# Patient Record
Sex: Female | Born: 1985 | Race: White | Hispanic: No | Marital: Married | Smoking: Former smoker
Health system: Southern US, Community
[De-identification: ages and names within clinical notes are randomized; demographics above are authoritative.]

## PROBLEM LIST (undated history)

## (undated) DIAGNOSIS — Z0000 Encounter for general adult medical examination without abnormal findings: Secondary | ICD-10-CM

## (undated) DIAGNOSIS — K219 Gastro-esophageal reflux disease without esophagitis: Secondary | ICD-10-CM

## (undated) DIAGNOSIS — F419 Anxiety disorder, unspecified: Secondary | ICD-10-CM

## (undated) DIAGNOSIS — F32A Depression, unspecified: Secondary | ICD-10-CM

## (undated) DIAGNOSIS — Z9889 Other specified postprocedural states: Secondary | ICD-10-CM

## (undated) DIAGNOSIS — Z13220 Encounter for screening for lipoid disorders: Secondary | ICD-10-CM

## (undated) DIAGNOSIS — D649 Anemia, unspecified: Secondary | ICD-10-CM

## (undated) DIAGNOSIS — F329 Major depressive disorder, single episode, unspecified: Secondary | ICD-10-CM

## (undated) DIAGNOSIS — R112 Nausea with vomiting, unspecified: Secondary | ICD-10-CM

## (undated) DIAGNOSIS — R5383 Other fatigue: Secondary | ICD-10-CM

## (undated) HISTORY — DX: Depression, unspecified: F32.A

## (undated) HISTORY — DX: Other fatigue: R53.83

## (undated) HISTORY — DX: Major depressive disorder, single episode, unspecified: F32.9

## (undated) HISTORY — DX: Anxiety disorder, unspecified: F41.9

## (undated) HISTORY — DX: Encounter for screening for lipoid disorders: Z13.220

## (undated) HISTORY — DX: Encounter for general adult medical examination without abnormal findings: Z00.00

---
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
## 2007-03-06 ENCOUNTER — Emergency Department: Payer: Self-pay | Admitting: Emergency Medicine

## 2007-12-06 ENCOUNTER — Ambulatory Visit: Payer: Self-pay

## 2008-02-27 ENCOUNTER — Observation Stay: Payer: Self-pay | Admitting: Obstetrics and Gynecology

## 2008-04-22 ENCOUNTER — Observation Stay: Payer: Self-pay

## 2008-05-14 ENCOUNTER — Emergency Department: Payer: Self-pay | Admitting: Emergency Medicine

## 2008-05-17 ENCOUNTER — Observation Stay: Payer: Self-pay

## 2008-05-27 ENCOUNTER — Observation Stay: Payer: Self-pay

## 2008-06-01 ENCOUNTER — Inpatient Hospital Stay: Payer: Self-pay | Admitting: Obstetrics and Gynecology

## 2008-08-26 IMAGING — US US BREAST BILAT
1 series · 17 of 25 positions shown · non-contrast
Comparison: none

REASON FOR EXAM: Bilateral fibrocystic tissue upper outer quadrant,
questionable right breast cyst
COMMENTS:

[Series 1: us breast bilat · 17 of 68 slices shown]
[im 1/68]
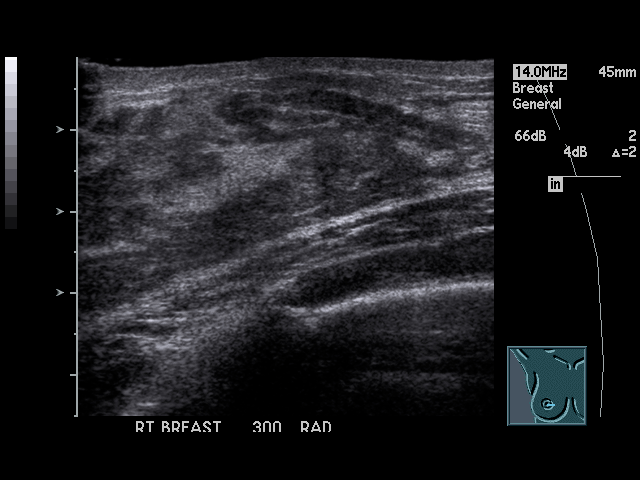
[im 6/68]
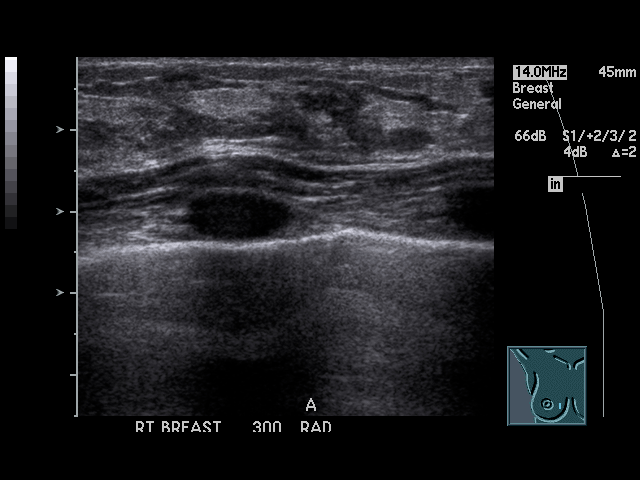
[im 9/68]
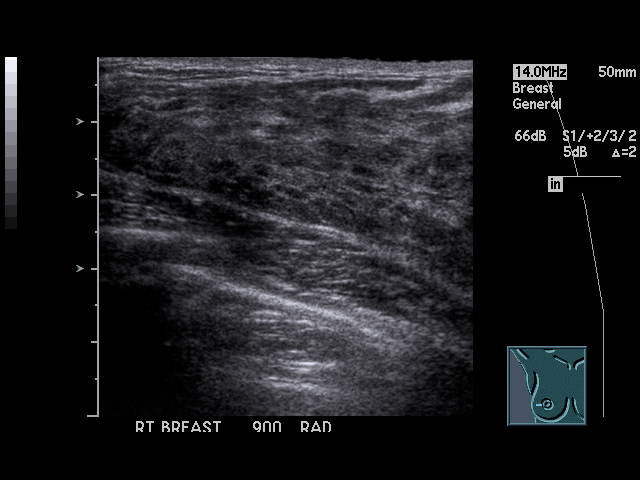
[im 14/68]
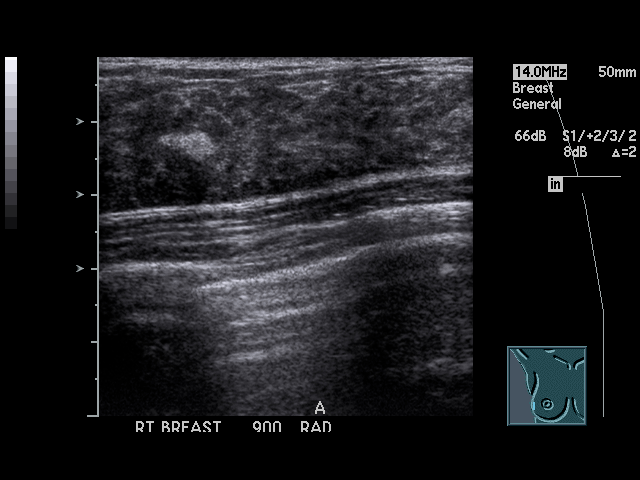
[im 17/68]
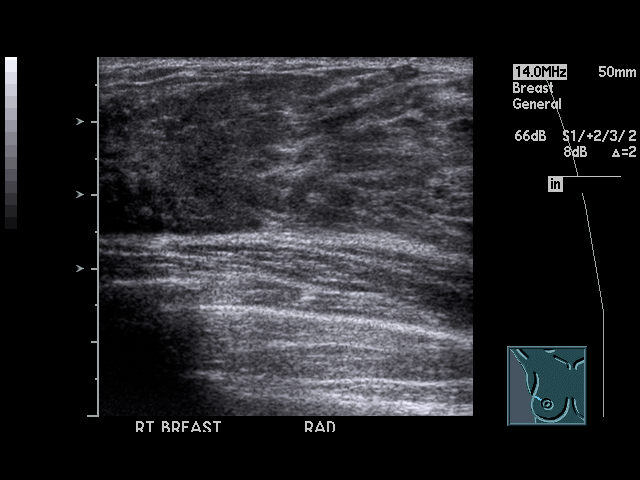
[im 23/68]
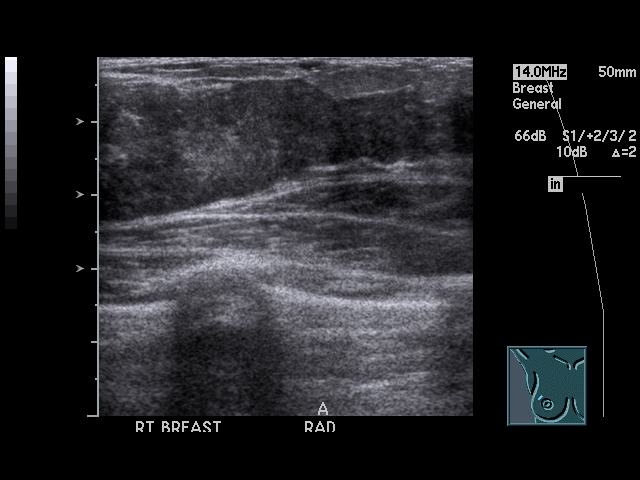
[im 26/68]
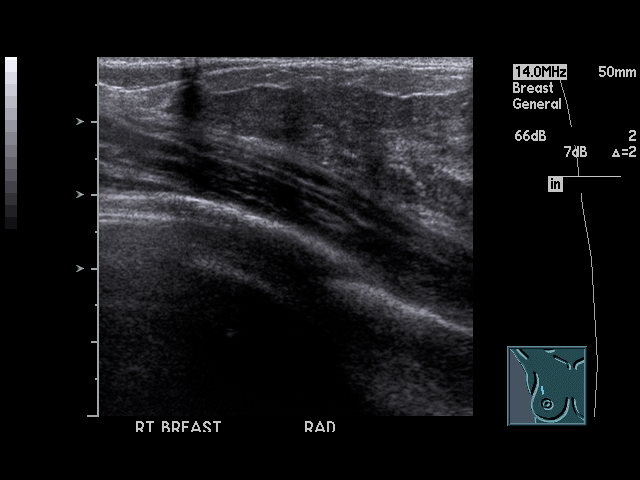
[im 31/68]
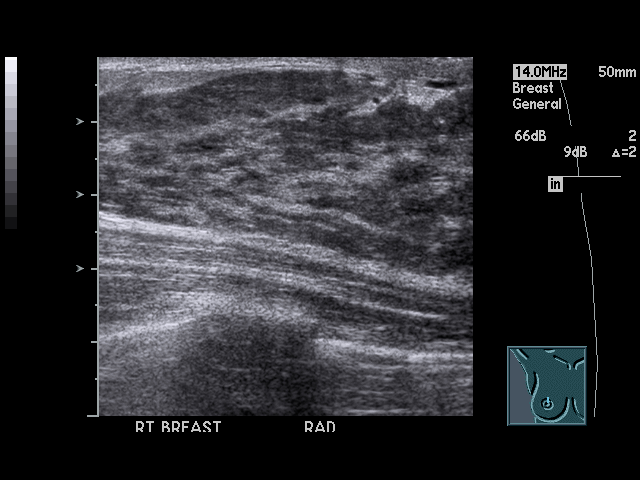
[im 34/68]
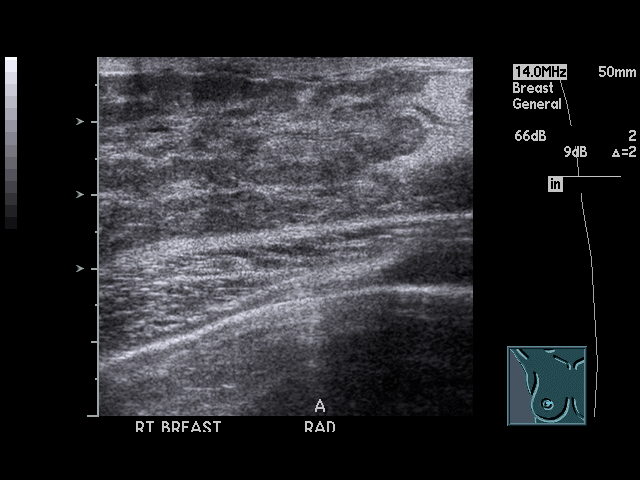
[im 37/68]
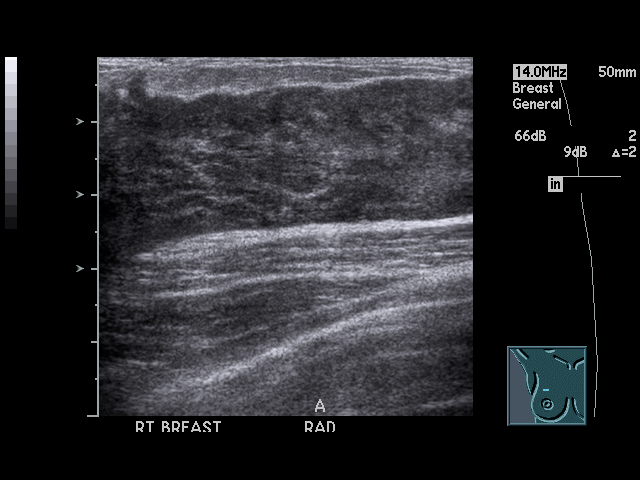
[im 42/68]
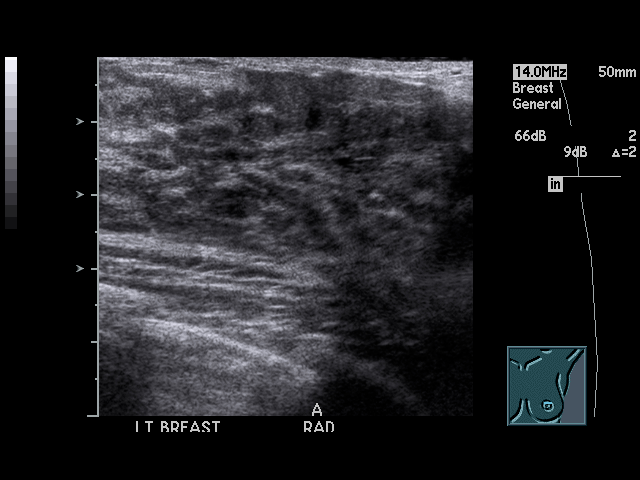
[im 45/68]
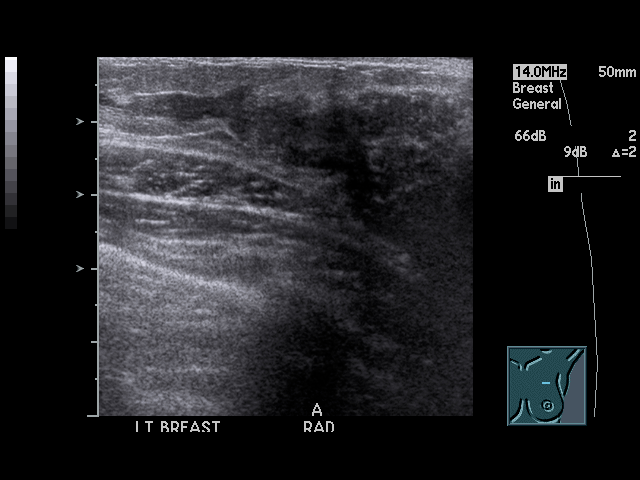
[im 51/68]
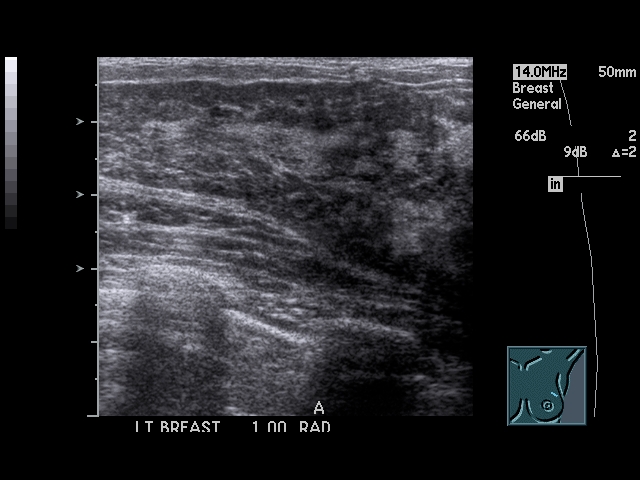
[im 54/68]
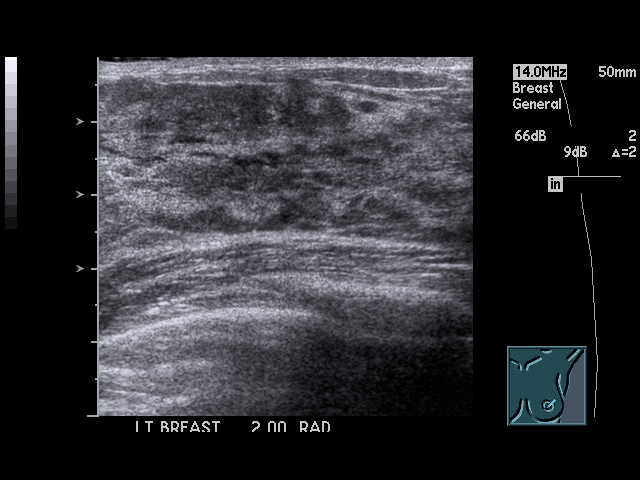
[im 59/68]
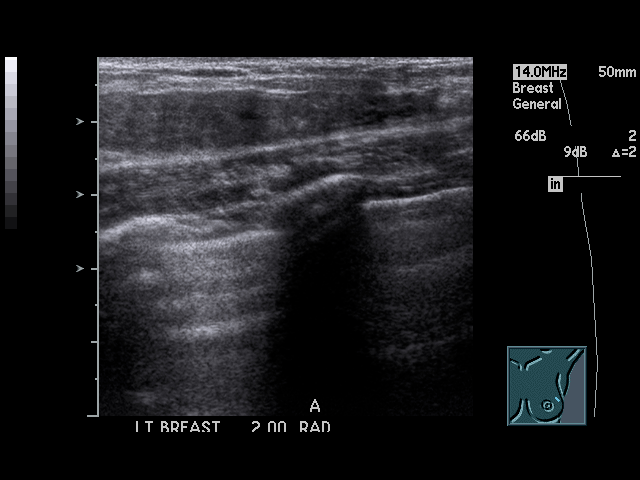
[im 62/68]
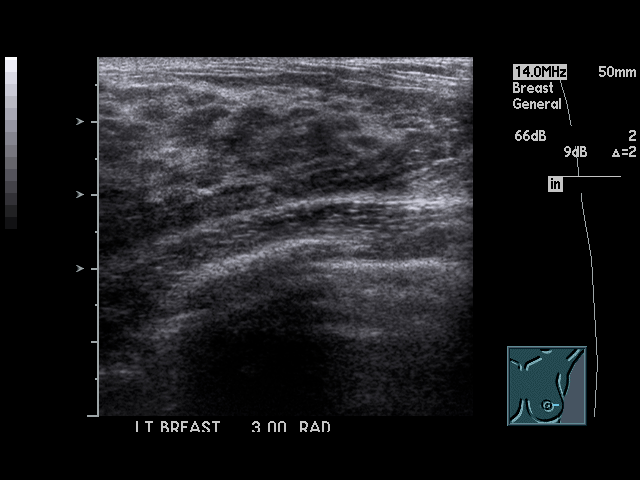
[im 68/68]
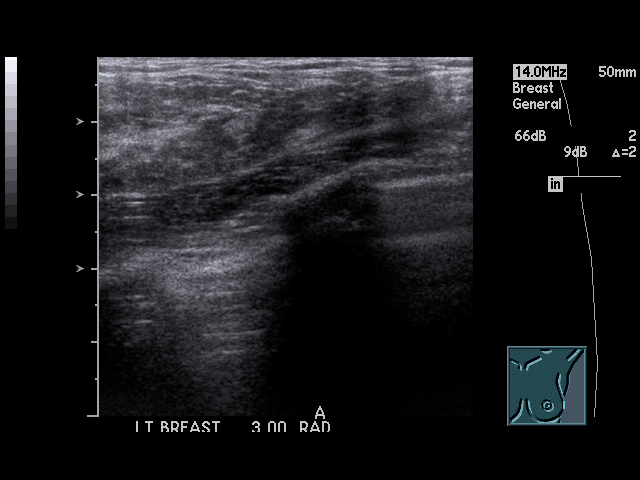

[17 of 25 positions shown; findings below may reference images not displayed]

PROCEDURE:     US  - US BREAST BILATERAL  - December 06, 2007 [DATE]

RESULT:     Targeted ultrasound in the upper/inner quadrant of the RIGHT
breast and upper/outer quadrant of the LEFT breast was performed.

No solid or cystic mass lesions are seen on either side. There is no
distortion of the breast architecture. No skin thickening is seen.
IMPRESSION: Bilaterally benign appearing targeted Breast Ultrasound.

## 2008-09-13 LAB — HM PAP SMEAR

## 2008-09-13 LAB — CONVERTED CEMR LAB

## 2009-09-12 ENCOUNTER — Ambulatory Visit: Payer: Self-pay | Admitting: Family Medicine

## 2009-09-12 DIAGNOSIS — R5383 Other fatigue: Secondary | ICD-10-CM

## 2009-09-12 DIAGNOSIS — R5381 Other malaise: Secondary | ICD-10-CM

## 2009-09-12 LAB — CONVERTED CEMR LAB
AST: 19 units/L (ref 0–37)
Albumin: 4.4 g/dL (ref 3.5–5.2)
Alkaline Phosphatase: 44 units/L (ref 39–117)
Basophils Absolute: 0 10*3/uL (ref 0.0–0.1)
Basophils Relative: 0.6 % (ref 0.0–3.0)
Bilirubin, Direct: 0.1 mg/dL (ref 0.0–0.3)
CO2: 28 meq/L (ref 19–32)
Calcium: 9.4 mg/dL (ref 8.4–10.5)
Eosinophils Absolute: 0.1 10*3/uL (ref 0.0–0.7)
GFR calc non Af Amer: 94.49 mL/min (ref >60)
Glucose, Bld: 84 mg/dL (ref 70–99)
Hemoglobin: 14.8 g/dL (ref 12.0–15.0)
Lymphs Abs: 1.6 10*3/uL (ref 0.7–4.0)
MCHC: 33.4 g/dL (ref 30.0–36.0)
MCV: 93.1 fL (ref 78.0–100.0)
Monocytes Absolute: 0.3 10*3/uL (ref 0.1–1.0)
Neutro Abs: 3.9 10*3/uL (ref 1.4–7.7)
Potassium: 3.9 meq/L (ref 3.5–5.1)
RBC: 4.77 M/uL (ref 3.87–5.11)
RDW: 11.8 % (ref 11.5–14.6)
Sodium: 136 meq/L (ref 135–145)
Total CHOL/HDL Ratio: 3

## 2009-11-17 ENCOUNTER — Ambulatory Visit: Payer: Self-pay | Admitting: Family Medicine

## 2009-11-17 DIAGNOSIS — H6690 Otitis media, unspecified, unspecified ear: Secondary | ICD-10-CM | POA: Insufficient documentation

## 2010-05-07 ENCOUNTER — Ambulatory Visit: Payer: Self-pay | Admitting: Family Medicine

## 2010-05-07 DIAGNOSIS — F341 Dysthymic disorder: Secondary | ICD-10-CM

## 2010-05-27 ENCOUNTER — Ambulatory Visit: Payer: Self-pay | Admitting: Psychology

## 2010-06-12 ENCOUNTER — Ambulatory Visit: Payer: Self-pay | Admitting: Family Medicine

## 2010-09-07 ENCOUNTER — Telehealth: Payer: Self-pay | Admitting: Family Medicine

## 2010-10-27 NOTE — Assessment & Plan Note (Signed)
Summary: 1 m f/u dlo  R/S FROM 06/08/10   Vital Signs:  Patient profile:   25 year old female Height:      62 inches Weight:      116 pounds BMI:     21.29 Temp:     98.3 degrees F oral Pulse rate:   72 / minute Pulse rhythm:   regular BP sitting:   120 / 80  (right arm) Cuff size:   regular  Vitals Entered By: Linde Gillis CMA Duncan Dull) (June 12, 2010 12:35 PM) CC: one month follow up   History of Present Illness: 25 yo here to follow up anxiety/depression.  Since starting Wellbutrin 150 mg daily last month, she feels "significantly better."  Get's less irritable, more energy and drive to get out of bed. Planning her daughter's birthday part which normally would make her anxious and she is not axious at all.  Improved concentration at work. Still has days where she feels a little sad but much less tearful.  Last several months, has been more tearful, irritable. Mom has h/o MDD.  Pt denies and SI or HI.    She would like to try a higher dose for a month or so but is really pleased with how it is working. She is also seeing Dr. Laymond Purser for psychotherapy which she feels is very helpful as well.  Current Medications (verified): 1)  Valtrex 1 Gm Tabs (Valacyclovir Hcl) .... 2 Tabs By Mouth Two Times A Day X 1 Day At First Sign of Mouth Sore. 2)  Wellbutrin Xl 300 Mg Xr24h-Tab (Bupropion Hcl) .Marland Kitchen.. 1 Tab By Mouth Every Morning.  Allergies (verified): No Known Drug Allergies  Past History:  Past Medical History: Last updated: 09/12/2009 G4P2  Family History: Last updated: 09/12/2009 Mom and dad both alive and healthy No CAD in family. Grandfather had Hairy Cell Leukemia, in remission and doing well.  Social History: Last updated: 09/12/2009 Lives with husband and two kids in [place] (ages 11 and 58 mo) Husband is a Engineer, maintenance (Dr. Cyndie Chime patient). 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  Risk Factors: Exercise: no  (09/12/2009)  Risk Factors: Smoking Status: current (09/12/2009)  Review of Systems      See HPI Psych:  Denies irritability, mental problems, panic attacks, sense of great danger, suicidal thoughts/plans, thoughts of violence, unusual visions or sounds, and thoughts /plans of harming others.  Physical Exam  General:  Well-developed,well-nourished,in no acute distress; alert,appropriate and cooperative throughout examination Psych:  Cognition and judgment appear intact. Alert and cooperative with normal attention span and concentration. No apparent delusions, illusions, hallucinations, seems much more relaxed.   Impression & Recommendations:  Problem # 1:  DEPRESSION/ANXIETY (ICD-300.4) Assessment Improved Time spent with patient 25 minutes, more than 50% of this time was spent counseling patient on anxiety/depression.  Will try higher dose, 300 mg daily of Wellbutrin.  Pt to call in one month with an update.  Complete Medication List: 1)  Valtrex 1 Gm Tabs (Valacyclovir hcl) .... 2 tabs by mouth two times a day x 1 day at first sign of mouth sore. 2)  Wellbutrin Xl 300 Mg Xr24h-tab (Bupropion hcl) .Marland Kitchen.. 1 tab by mouth every morning. Prescriptions: WELLBUTRIN XL 300 MG XR24H-TAB (BUPROPION HCL) 1 tab by mouth every morning.  #30 x 11   Entered and Authorized by:   Ruthe Mannan MD   Signed by:   Ruthe Mannan MD on 06/12/2010   Method used:   [facility]  [address] [id number]       [address]       [place], [place]  [address]       Ph: [phone]       Fax: [phone]   RxID:   [id number]   Current Allergies (reviewed today): No known allergies

## 2010-10-27 NOTE — Assessment & Plan Note (Signed)
Summary: EAR INFECTION / LFW   Vital Signs:  Patient profile:   25 year old female Height:      62 inches Weight:      120 pounds BMI:     22.03 Temp:     98.4 degrees F oral Pulse rate:   88 / minute Pulse rhythm:   regular BP sitting:   112 / 74  (left arm) Cuff size:   regular  Vitals Entered By: Delilah Shan CMA Duncan Dull) (November 17, 2009 4:18 PM) CC: ? Ear infection   History of Present Illness: 25 yo with left shooting pain for a few days. Also felt some swollen nodes around her left ear. Was a little congested last week but otherwise feels okay. No fevers, chills, hearing loss, sore throat, or other symptoms.  No pain with chewing. No difficulty swallowing.  Current Medications (verified): 1)  Amoxicillin 500 Mg Tabs (Amoxicillin) .Marland Kitchen.. 1 Tab By Mouth Two Times A Day X 10 Days  Allergies (verified): No Known Drug Allergies  Review of Systems      See HPI General:  Denies chills and fever. ENT:  Complains of earache; denies nasal congestion, postnasal drainage, sinus pressure, and sore throat. CV:  Denies chest pain or discomfort. Resp:  Denies shortness of breath. GI:  Denies diarrhea, nausea, and vomiting.  Physical Exam  General:  Well-developed,well-nourished,in no acute distress; alert,appropriate and cooperative throughout examination Ears:  Left ear- R ear normal and L TM erythema, mod fluid.   Mouth:  Oral mucosa and oropharynx without lesions or exudates.  Teeth in good repair. Lungs:  Normal respiratory effort, chest expands symmetrically. Lungs are clear to auscultation, no crackles or wheezes. Heart:  Normal rate and regular rhythm. S1 and S2 normal without gallop, murmur, click, rub or other extra sounds. Cervical Nodes:  No lymphadenopathy noted Axillary Nodes:  No palpable lymphadenopathy   Impression & Recommendations:  Problem # 1:  LOM (ICD-382.9) Assessment New Although less common in adults, does have typical findings. Will treat with  10 day course of amoxicillin.  follow up as needed. Her updated medication list for this problem includes:    Amoxicillin 500 Mg Tabs (Amoxicillin) .Marland Kitchen... 1 tab by mouth two times a day x 10 days  Complete Medication List: 1)  Amoxicillin 500 Mg Tabs (Amoxicillin) .Marland Kitchen.. 1 tab by mouth two times a day x 10 days Prescriptions: AMOXICILLIN 500 MG TABS (AMOXICILLIN) 1 tab by mouth two times a day x 10 days  #20 x 0   Entered and Authorized by:   Ruthe Mannan MD   Signed by:   Ruthe Mannan MD on 11/17/2009   Method used:   Print then Give to Patient   RxID:   [id number]   Prior Medications (reviewed today): None Current Allergies (reviewed today): No known allergies

## 2010-10-27 NOTE — Assessment & Plan Note (Signed)
Summary: meds and concerns/dlo   Vital Signs:  Patient profile:   25 year old female Height:      62 inches Weight:      117.13 pounds BMI:     21.50 Temp:     98.7 degrees F oral Pulse rate:   72 / minute Pulse rhythm:   regular BP sitting:   120 / 80  (right arm) Cuff size:   regular  Vitals Entered By: Linde Gillis CMA Duncan Dull) (May 07, 2010 7:53 AM) CC: discuss being on medication   History of Present Illness: 25 yo here to discuss anxiety/depression.  Last several months, has been more tearful, irritable. Loss of interest in activities she used to like to do, hypersomnia. Difficulty concentrating at work.  Has two small children in addition to her husbands two small children. Becomes very upset when his ex wife drops his kids off without letting them know in advance.  Loves the children like her own, but likes to be prepared with food, babysitting, etc.  Never been on anything for depression or anxiety in past. Mom has h/o MDD.  Pt denies and SI or HI.    Current Medications (verified): 1)  Wellbutrin Sr 150 Mg Xr12h-Tab (Bupropion Hcl) .Marland Kitchen.. 1 Tab By Mouth Every Morning. 2)  Valtrex 1 Gm Tabs (Valacyclovir Hcl) .... 2 Tabs By Mouth Two Times A Day X 1 Day At First Sign of Mouth Sore.  Allergies (verified): No Known Drug Allergies  Past History:  Past Medical History: Last updated: 09/12/2009 G4P2  Family History: Last updated: 09/12/2009 Mom and dad both alive and healthy No CAD in family. Grandfather had Hairy Cell Leukemia, in remission and doing well.  Social History: Last updated: 09/12/2009 Lives with husband and two kids in [place] (ages 6 and 24 mo) Husband is a Engineer, maintenance (Dr. Cyndie Chime patient). 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  Risk Factors: Exercise: no (09/12/2009)  Risk Factors: Smoking Status: current (09/12/2009)  Review of Systems      See HPI Psych:  Complains of  anxiety, depression, easily angered, easily tearful, and irritability; denies mental problems, panic attacks, sense of great danger, suicidal thoughts/plans, thoughts of violence, unusual visions or sounds, and thoughts /plans of harming others.  Physical Exam  General:  Well-developed,well-nourished,in no acute distress; alert,appropriate and cooperative throughout examination Psych:  Cognition and judgment appear intact. Alert and cooperative with normal attention span and concentration. No apparent delusions, illusions, hallucinations   Impression & Recommendations:  Problem # 1:  DEPRESSION/ANXIETY (ICD-300.4) Assessment New Time spent with patient 25 minutes, more than 50% of this time was spent counseling patient on anxiety/depression. Given symptoms discussed, would like benefit most from Wellbutrin.  Will start with 150 mg daily, follow up in one month. Also referred to psychology for psychotherapy.  Orders: Psychology Referral (Psychology)  Complete Medication List: 1)  Wellbutrin Sr 150 Mg Xr12h-tab (Bupropion hcl) .Marland Kitchen.. 1 tab by mouth every morning. 2)  Valtrex 1 Gm Tabs (Valacyclovir hcl) .... 2 tabs by mouth two times a day x 1 day at first sign of mouth sore.  Patient Instructions: 1)  Please make appointment to see me in one month. 2)  Please stop by to see Shirlee Limerick on your way out. Prescriptions: VALTREX 1 GM TABS (VALACYCLOVIR HCL) 2 tabs by mouth two times a day x 1 day at first sign of mouth sore.  #4 x 0   Entered and Authorized by:   Ruthe Mannan MD  Signed by:   Ruthe Mannan MD on 05/07/2010   Method used:   Historical   RxID:   [id number] WELLBUTRIN SR 150 MG XR12H-TAB (BUPROPION HCL) 1 tab by mouth every morning.  #30 x 0   Entered and Authorized by:   Ruthe Mannan MD   Signed by:   Ruthe Mannan MD on 05/07/2010   Method used:   [id number]       [address]       [place], [place]  [address]       Ph:  [phone]       Fax: [phone]   RxID:   [id number]   Prior Medications (reviewed today): None Current Allergies (reviewed today): No known allergies

## 2010-10-29 NOTE — Progress Notes (Signed)
Summary: refill request for valtrex  Phone Note Refill Request Call back at [phone] Message from:  Patient  Refills Requested: Medication #1:  VALTREX 1 GM TABS 2 tabs by mouth two times a day x 1 day at first sign of mouth sore. [facility] [address]  Initial call taken by: Lowella Petties CMA, AAMA,  September 07, 2010 8:50 AM    Prescriptions: VALTREX 1 GM TABS (VALACYCLOVIR HCL) 2 tabs by mouth two times a day x 1 day at first sign of mouth sore.  #4 x 3   Entered and Authorized by:   Ruthe Mannan MD   Signed by:   Ruthe Mannan MD on 09/07/2010   Method used:   [facility]        [phone][facility] (retail)       [address] [place], [place]  [address]       Ph: [phone] or [phone]       Fax: [phone]   RxID:   [id number]

## 2012-04-24 ENCOUNTER — Encounter: Payer: Self-pay | Admitting: Family Medicine

## 2012-04-25 ENCOUNTER — Other Ambulatory Visit (INDEPENDENT_AMBULATORY_CARE_PROVIDER_SITE_OTHER): Payer: Self-pay

## 2012-04-25 ENCOUNTER — Encounter (INDEPENDENT_AMBULATORY_CARE_PROVIDER_SITE_OTHER): Payer: Self-pay | Admitting: Family Medicine

## 2012-04-25 ENCOUNTER — Other Ambulatory Visit: Payer: Self-pay | Admitting: Family Medicine

## 2012-04-25 DIAGNOSIS — Z0000 Encounter for general adult medical examination without abnormal findings: Secondary | ICD-10-CM

## 2012-04-25 DIAGNOSIS — Z136 Encounter for screening for cardiovascular disorders: Secondary | ICD-10-CM

## 2012-04-25 LAB — COMPREHENSIVE METABOLIC PANEL
ALT: 15 U/L (ref 0–35)
AST: 19 U/L (ref 0–37)
Albumin: 4.4 g/dL (ref 3.5–5.2)
CO2: 26 mEq/L (ref 19–32)
Calcium: 9.6 mg/dL (ref 8.4–10.5)
Chloride: 104 mEq/L (ref 96–112)
Creatinine, Ser: 0.9 mg/dL (ref 0.4–1.2)
Potassium: 3.7 mEq/L (ref 3.5–5.1)
Sodium: 139 mEq/L (ref 135–145)
Total Protein: 6.9 g/dL (ref 6.0–8.3)

## 2012-04-25 LAB — LIPID PANEL
HDL: 72.3 mg/dL (ref >39.00)
Total CHOL/HDL Ratio: 3

## 2012-04-25 NOTE — Progress Notes (Signed)
  Subjective:    Patient ID: Courtney Ruiz, female    DOB: 09-Feb-1986, 26 y.o.   MRN: [id number]  HPI   Review of Systems     Objective:   Physical Exam        Assessment & Plan:

## 2012-04-27 ENCOUNTER — Other Ambulatory Visit: Payer: Self-pay

## 2012-04-27 ENCOUNTER — Ambulatory Visit (INDEPENDENT_AMBULATORY_CARE_PROVIDER_SITE_OTHER): Payer: Self-pay | Admitting: Family Medicine

## 2012-04-27 ENCOUNTER — Other Ambulatory Visit (HOSPITAL_COMMUNITY)
Admission: RE | Admit: 2012-04-27 | Discharge: 2012-04-27 | Disposition: A | Discharge status: Home / Self Care | Payer: Self-pay | Source: Ambulatory Visit | Attending: Family Medicine | Admitting: Family Medicine

## 2012-04-27 ENCOUNTER — Encounter: Payer: Self-pay | Admitting: Family Medicine

## 2012-04-27 VITALS — BP 110/62 | HR 60 | Temp 97.9°F | Ht 61.25 in | Wt 118.0 lb

## 2012-04-27 DIAGNOSIS — Z01419 Encounter for gynecological examination (general) (routine) without abnormal findings: Secondary | ICD-10-CM | POA: Insufficient documentation

## 2012-04-27 DIAGNOSIS — F341 Dysthymic disorder: Secondary | ICD-10-CM

## 2012-04-27 DIAGNOSIS — Z0000 Encounter for general adult medical examination without abnormal findings: Secondary | ICD-10-CM

## 2012-04-27 MED ORDER — CITALOPRAM HYDROBROMIDE 20 MG PO TABS: 20.0000 mg | ORAL_TABLET | Freq: Every day | ORAL | Status: DC

## 2012-04-27 NOTE — Patient Instructions (Addendum)
We are starting celexa. Please call me in 1 month with an update.  The first few days, try cutting your tablet in half.  Citalopram tablets What is this medicine? CITALOPRAM (sye TAL oh pram) is a medicine for depression. This medicine may be used for other purposes; ask your health care provider or pharmacist if you have questions. What should I tell my health care provider before I take this medicine? They need to know if you have any of these conditions: -bipolar disorder or a family history of bipolar disorder -diabetes -heart disease -history of irregular heartbeat -kidney or liver disease -low levels of magnesium or potassium in the blood -receiving electroconvulsive therapy -seizures (convulsions) -suicidal thoughts or a previous suicide attempt -an unusual or allergic reaction to citalopram, escitalopram, other medicines, foods, dyes, or preservatives -pregnant or trying to become pregnant -breast-feeding How should I use this medicine? Take this medicine by mouth with a glass of water. Follow the directions on the prescription label. You can take it with or without food. Take your medicine at regular intervals. Do not take your medicine more often than directed. Do not stop taking except on your doctor's advice. A special MedGuide will be given to you by the pharmacist with each prescription and refill. Be sure to read this information carefully each time. Talk to your pediatrician regarding the use of this medicine in children. Special care may be needed. Patients over 35 years old may have a stronger reaction and need a smaller dose. Overdosage: If you think you have taken too much of this medicine contact a poison control center or emergency room at once. NOTE: This medicine is only for you. Do not share this medicine with others. What if I miss a dose? If you miss a dose, take it as soon as you can. If it is almost time for your next dose, take only that dose. Do not take  double or extra doses. What may interact with this medicine? Do not take this medicine with any of the following medications: -certain diet drugs like dexfenfluramine, fenfluramine, phentermine, sibutramine -cisapride -escitalopram -linezolid -MAOIs like Carbex, Eldepryl, Marplan, Nardil, and Parnate -methylene blue -pimozide -procarbazine -tryptophan This medicine may also interact with the following medications: -amphetamine or dextroamphetamine -arsenic trioxide -aspirin and aspirin-like drugs -carbamazepine -certain medicines for fungal infections like ketoconazole and itraconazole -certain medicines used to treat infections like chloroquine, clarithromycin, erythromycin, pentamidine -certain medicines for irregular heart beat like amiodarone, disopyramide, dofetilide, ibutilide, procainamide, propafenone, quinidine, sotalol -cimetidine -lithium -medicines for depression, anxiety, or psychotic disturbances -medicines for migraine headache like almotriptan, eletriptan, frovatriptan, naratriptan, rizatriptan, sumatriptan, zolmitriptan -medicines that treat or prevent blood clots like warfarin, enoxaparin, and dalteparin -medicines that treat HIV infection or AIDS -methadone -metoprolol -NSAIDs, medicines for pain and inflammation, like ibuprofen or naproxen -omeprazole -posaconazole -St. John's wort -tramadol This list may not describe all possible interactions. Give your health care provider a list of all the medicines, herbs, non-prescription drugs, or dietary supplements you use. Also tell them if you smoke, drink alcohol, or use illegal drugs. Some items may interact with your medicine. What should I watch for while using this medicine? Visit your doctor or health care professional for regular checks on your progress. Continue to take your medicine even if you do not feel better right away. It can take about 4 weeks before you feel the full effect of this medicine. Patients  and their families should watch out for depression or thoughts of suicide that get worse.  Also watch out for sudden or severe changes in feelings such as feeling anxious, agitated, panicky, irritable, hostile, aggressive, impulsive, severely restless, overly excited and hyperactive, or not being able to sleep. If this happens, especially at the beginning of antidepressant treatment or after a change in dose, call your health care professional. If you have been taking this medicine regularly for some time, do not suddenly stop taking it. You must gradually reduce the dose, or your symptoms may get worse. Ask your doctor or health care professional for advice. You may get drowsy or dizzy. Do not drive, use machinery, or do anything that needs mental alertness until you know how this medicine affects you. Do not stand or sit up quickly, especially if you are an older patient. This reduces the risk of dizzy or fainting spells. Alcohol may interfere with the effect of this medicine. Avoid alcoholic drinks. Do not treat yourself for coughs, colds, or allergies without asking your doctor or health care professional for advice. Some ingredients can increase possible side effects. Your mouth may get dry. Chewing sugarless gum or sucking hard candy, and drinking plenty of water will help. What side effects may I notice from receiving this medicine? Side effects that you should report to your doctor or health care professional as soon as possible: -allergic reactions like skin rash, itching or hives, swelling of the face, lips, or tongue -chest pain -confusion -dizziness -fast, irregular heartbeat -fast talking and excited feelings or actions that are out of control -feeling faint or lightheaded, falls -hallucination, loss of contact with reality -seizures -shortness of breath -suicidal thoughts or other mood changes -unusual bleeding or bruising Side effects that usually do not require medical attention  (report to your doctor or health care professional if they continue or are bothersome): -blurred vision -change in appetite -change in sex drive or performance -headache -increased sweating -nausea -trouble sleeping This list may not describe all possible side effects. Call your doctor for medical advice about side effects. You may report side effects to FDA at 1-800-FDA-1088. Where should I keep my medicine? Keep out of reach of children. Store at room temperature between 15 and 30 degrees C (59 and 86 degrees F). Throw away any unused medicine after the expiration date. NOTE: This sheet is a summary. It may not cover all possible information. If you have questions about this medicine, talk to your doctor, pharmacist, or health care provider.  2012, [employer]vier/[employer] Standard. (05/22/2010 10:48:06 AM)

## 2012-04-27 NOTE — Telephone Encounter (Signed)
[facility] and med was not there. I spoke with Nedra Hai [facility] Cital[facility]ram ready for pick up. Pt notified while on phone.

## 2012-04-27 NOTE — Progress Notes (Signed)
Subjective:    Patient ID: Courtney Ruiz, female    DOB: 1986-05-12, 26 y.o.   MRN: [id number]  HPI 26 yo healthy G4P2 here for CPX.    Well woman-  Has Mirena IUD (inserted 09/2008).  Minimal periods, very happy with Mirena.  Sexually active with husband of 2 years only.  Never had an abnormal pap.   Last pap smear in ?2009.  Has GYN but would like pap smear today. Smokes occassionally when she is drinking with friends, feels she is not addicted.  Anxiety/depression- symptoms were controlled on Wellbutrin.  Stopped taking it- was causing nausea.  Still feels very anxious, at times tearful. No SI or HI but would like to try another antidepressant.  Patient Active Problem List  Diagnosis  . DEPRESSION/ANXIETY  . LOM  . FATIGUE  . Routine general medical examination at a health care facility   Past Medical History  Diagnosis Date  . Depression   . Anxiety   . Fatigue   . Routine general medical examination at a health care facility   . Screening for lipoid disorders    No past surgical history on file. History  Substance Use Topics  . Smoking status: Current Everyday Smoker  . Smokeless tobacco: Not on file  . Alcohol Use: Yes   Family History  Problem Relation Age of Onset  . Coronary artery disease Neg Hx    Allergies not on file Current Outpatient Prescriptions on File Prior to Visit  Medication Sig Dispense Refill  . buPROPion (WELLBUTRIN XL) 300 MG 24 hr tablet Take 300 mg by mouth daily.      . valACYclovir (VALTREX) 1000 MG tablet 2 tablets by mouth two times a day x one day at first sign of mouth sore       The PMH, PSH, Social History, Family History, Medications, and allergies have been reviewed in [facility], and have been updated if relevant.   Review of Systems See HPI Patient reports no  vision/ hearing changes,anorexia, weight change, fever ,adenopathy, persistant / recurrent hoarseness, swallowing issues, chest pain, edema,persistant / recurrent cough,  hemoptysis, dyspnea(rest, exertional, paroxysmal nocturnal), gastrointestinal  bleeding (melena, rectal bleeding), abdominal pain, excessive heart burn, GU symptoms(dysuria, hematuria, pyuria, voiding/incontinence  Issues) syncope, focal weakness, severe memory loss, concerning skin lesions, depression, anxiety, abnormal bruising/bleeding, major joint swelling, breast masses or abnormal vaginal bleeding.       Objective:   Physical Exam BP 110/62  Pulse 60  Temp 97.9 F (36.6 C)  Ht 5' 1.25" (1.556 m)  Wt 118 lb (53.524 kg)  BMI 22.11 kg/m2   General:  Well-developed,well-nourished,in no acute distress; alert,appropriate and cooperative throughout examination Head:  normocephalic and atraumatic.   Eyes:  vision grossly intact, pupils equal, pupils round, and pupils reactive to light.   Ears:  R ear normal and L ear normal.   Nose:  no external deformity.   Mouth:  good dentition.   Neck:  No deformities, masses, or tenderness noted. Breasts:  No mass, nodules, thickening, tenderness, bulging, retraction, inflamation, nipple discharge or skin changes noted.   Lungs:  Normal respiratory effort, chest expands symmetrically. Lungs are clear to auscultation, no crackles or wheezes. Heart:  Normal rate and regular rhythm. S1 and S2 normal without gallop, murmur, click, rub or other extra sounds. Abdomen:  Bowel sounds positive,abdomen soft and non-tender without masses, organomegaly or hernias noted. Rectal:  no external abnormalities.   Genitalia:  Pelvic Exam:        External:  normal female genitalia without lesions or masses        Vagina: normal without lesions or masses        Cervix: normal without lesions or masses        IUD strings visible, in place.        Adnexa: normal bimanual exam without masses or fullness        Uterus: normal by palpation        Pap smear: performed Msk:  No deformity or scoliosis noted of thoracic or lumbar spine.   Extremities:  No clubbing, cyanosis,  edema, or deformity noted with normal full range of motion of all joints.   Neurologic:  alert & oriented X3 and gait normal.   Skin:  Intact without suspicious lesions or rashes Cervical Nodes:  No lymphadenopathy noted Axillary Nodes:  No palpable lymphadenopathy Psych:  Cognition and judgment appear intact. Alert and cooperative with normal attention span and concentration. No apparent delusions, illusions, hallucinations     Assessment & Plan:   1. Routine general medical examination at a health care facility  Reviewed preventive care protocols, scheduled due services, and updated immunizations Discussed nutrition, exercise, diet, and healthy lifestyle.  Cytology - PAP  2. DEPRESSION/ANXIETY  Deteriorated. D/c wellbutrin. Start celexa 20 mg daily. See pt instructions for details.

## 2012-05-02 ENCOUNTER — Encounter: Payer: Self-pay | Admitting: Family Medicine

## 2012-05-02 ENCOUNTER — Encounter: Payer: Self-pay | Admitting: *Deleted

## 2014-01-17 ENCOUNTER — Encounter: Payer: Self-pay | Admitting: Family Medicine

## 2014-01-17 ENCOUNTER — Encounter: Payer: Self-pay | Admitting: *Deleted

## 2014-01-17 ENCOUNTER — Ambulatory Visit (INDEPENDENT_AMBULATORY_CARE_PROVIDER_SITE_OTHER): Payer: BC Managed Care – PPO | Admitting: Family Medicine

## 2014-01-17 VITALS — BP 104/68 | HR 69 | Temp 98.2°F | Ht 61.25 in | Wt 126.0 lb

## 2014-01-17 DIAGNOSIS — Z0000 Encounter for general adult medical examination without abnormal findings: Secondary | ICD-10-CM

## 2014-01-17 DIAGNOSIS — Z136 Encounter for screening for cardiovascular disorders: Secondary | ICD-10-CM

## 2014-01-17 DIAGNOSIS — F988 Other specified behavioral and emotional disorders with onset usually occurring in childhood and adolescence: Secondary | ICD-10-CM

## 2014-01-17 LAB — COMPREHENSIVE METABOLIC PANEL
ALK PHOS: 41 U/L (ref 39–117)
ALT: 25 U/L (ref 0–35)
AST: 20 U/L (ref 0–37)
Albumin: 4.4 g/dL (ref 3.5–5.2)
BILIRUBIN TOTAL: 0.9 mg/dL (ref 0.3–1.2)
BUN: 16 mg/dL (ref 6–23)
CO2: 29 mEq/L (ref 19–32)
CREATININE: 0.9 mg/dL (ref 0.4–1.2)
Calcium: 9.7 mg/dL (ref 8.4–10.5)
Chloride: 102 mEq/L (ref 96–112)
GFR: 78.62 mL/min (ref >60.00)
Glucose, Bld: 82 mg/dL (ref 70–99)
Potassium: 3.7 mEq/L (ref 3.5–5.1)
SODIUM: 138 meq/L (ref 135–145)
TOTAL PROTEIN: 7.5 g/dL (ref 6.0–8.3)

## 2014-01-17 LAB — LIPID PANEL
CHOL/HDL RATIO: 3
Cholesterol: 200 mg/dL (ref 0–200)
HDL: 64.6 mg/dL (ref >39.00)
LDL Cholesterol: 123 mg/dL — ABNORMAL HIGH (ref 0–99)
TRIGLYCERIDES: 64 mg/dL (ref 0.0–149.0)
VLDL: 12.8 mg/dL (ref 0.0–40.0)

## 2014-01-17 LAB — CBC WITH DIFFERENTIAL/PLATELET
BASOS ABS: 0.1 10*3/uL (ref 0.0–0.1)
Basophils Relative: 0.8 % (ref 0.0–3.0)
EOS ABS: 0.3 10*3/uL (ref 0.0–0.7)
Eosinophils Relative: 5 % (ref 0.0–5.0)
HEMATOCRIT: 44.2 % (ref 36.0–46.0)
HEMOGLOBIN: 15.2 g/dL — AB (ref 12.0–15.0)
LYMPHS ABS: 1.9 10*3/uL (ref 0.7–4.0)
Lymphocytes Relative: 29.6 % (ref 12.0–46.0)
MCHC: 34.5 g/dL (ref 30.0–36.0)
MCV: 90.1 fl (ref 78.0–100.0)
MONO ABS: 0.3 10*3/uL (ref 0.1–1.0)
MONOS PCT: 5.3 % (ref 3.0–12.0)
NEUTROS ABS: 3.9 10*3/uL (ref 1.4–7.7)
Neutrophils Relative %: 59.3 % (ref 43.0–77.0)
PLATELETS: 259 10*3/uL (ref 150.0–400.0)
RBC: 4.9 Mil/uL (ref 3.87–5.11)
RDW: 12.6 % (ref 11.5–14.6)
WBC: 6.6 10*3/uL (ref 4.5–10.5)

## 2014-01-17 LAB — TSH: TSH: 2.78 u[IU]/mL (ref 0.35–5.50)

## 2014-01-17 MED ORDER — AMPHETAMINE-DEXTROAMPHET ER 20 MG PO CP24: 20.0000 mg | ORAL_CAPSULE | Freq: Every day | ORAL | Status: DC

## 2014-01-17 NOTE — Progress Notes (Signed)
Pre visit review using our clinic review tool, if applicable. No additional management support is needed unless otherwise documented below in the visit note. 

## 2014-01-17 NOTE — Assessment & Plan Note (Signed)
>  25 minutes spent in face to face time with patient, >50% spent in counselling or coordination of care She has had formal psych testing and results have been scanned into chart. Start Adderall 20 mg XL daily. Follow up 1 month. The patient indicates understanding of these issues and agrees with the plan.

## 2014-01-17 NOTE — Assessment & Plan Note (Signed)
Reviewed preventive care protocols, scheduled due services, and updated immunizations Discussed nutrition, exercise, diet, and healthy lifestyle.  Has GYN.  Orders Placed This Encounter  Procedures  . CBC with Differential  . Comprehensive metabolic panel  . Lipid panel  . TSH

## 2014-01-17 NOTE — Patient Instructions (Addendum)
Great to see you. You can call me in about a month to let me know how the adderall is working.  We will call you with your lab results.

## 2014-01-17 NOTE — Progress Notes (Signed)
Subjective:    Patient ID: Courtney Ruiz, female    DOB: [birth year], 28 y.o.   MRN: [id number]  HPI 28 yo healthy G4P2 here for CPX.   Had Mirena but now [facility]. Scheduled for pap smear in 01/2014. Last pap smear was done by me in 04/27/2012.  Sexually active with husband only.  Never had an abnormal pap.     Smoking a little more than she used to- less than 1 ppd.  Not ready to quit smoking yet.  Has promised her kids she will quit by the time she is 28 yo.  Anxiety/depression- stopped taking Wellbutrin- did not like how it made her feel- also caused nausea.  Stopped taking Celexa as well- could not remember to take it every day.  She started to see Harle BattiestJulia Tabor, therapist- sees her once a week.  Amil AmenJulia sent me a letter stating that that she test highly positive for ADD- 87%, and recommended stimulants.    Has been drinking monster drinks- therapist thinks she is self medicating.  She also thinks that her anxiety is mainly due to her ADD.  She has five kids and having difficulty focusing at home and at work.   Patient Active Problem List   Diagnosis Date Noted  . Routine general medical examination at a health care facility 04/25/2012  . DEPRESSION/ANXIETY 05/07/2010   Past Medical History  Diagnosis Date  . Depression   . Anxiety   . Fatigue   . Routine general medical examination at a health care facility   . Screening for lipoid disorders    No past surgical history on file. History  Substance Use Topics  . Smoking status: Current Every Day Smoker -- 0.50 packs/day    Types: Cigarettes  . Smokeless tobacco: Not on file  . Alcohol Use: Yes     Comment: occ   Family History  Problem Relation Age of Onset  . Coronary artery disease Neg Hx    No Known Allergies Current Outpatient Prescriptions on File Prior to Visit  Medication Sig Dispense Refill  . valACYclovir (VALTREX) 1000 MG tablet 2 tablets by mouth two times a day x one day at  first sign of mouth sore       No current facility-administered medications on file prior to visit.   The PMH, PSH, Social History, Family History, Medications, and allergies have been reviewed [facility], and have been updated if relevant.   Review of Systems See HPI +fatigue  Patient reports no  vision/ hearing changes,anorexia, weight change, fever ,adenopathy, persistant / recurrent hoarseness, swallowing issues, chest pain, edema,persistant / recurrent cough, hemoptysis, dyspnea(rest, exertional, paroxysmal nocturnal), gastrointestinal  bleeding (melena, rectal bleeding), abdominal pain, excessive heart burn, GU symptoms(dysuria, hematuria, pyuria, voiding/incontinence  Issues) syncope, focal weakness, severe memory loss, concerning skin lesions, depression, anxiety, abnormal bruising/bleeding, major joint swelling, breast masses or abnormal vaginal bleeding.       Objective:   Physical Exam BP 104/68  Pulse 69  Temp(Src) 98.2 F (36.8 C) (Oral)  Ht 5' 1.25" (1.556 m)  Wt 126 lb (57.153 kg)  BMI 23.61 kg/m2  SpO2 97%   General:  Well-developed,well-nourished,in no acute distress; alert,appropriate and cooperative throughout examination Head:  normocephalic and atraumatic.   Eyes:  vision grossly intact, pupils equal, pupils round, and pupils reactive to light.   Ears:  R ear normal and L ear normal.   Nose:  no external deformity.   Mouth:  good dentition.  Neck:  No deformities, masses, or tenderness noted..   Lungs:  Normal respiratory effort, chest expands symmetrically. Lungs are clear to auscultation, no crackles or wheezes. Heart:  Normal rate and regular rhythm. S1 and S2 normal without gallop, murmur, click, rub or other extra sounds. Abdomen:  Bowel sounds positive,abdomen soft and non-tender without masses, organomegaly or hernias noted. Msk:  No deformity or scoliosis noted of thoracic or lumbar spine.   Extremities:  No clubbing, cyanosis, edema, or deformity noted  with normal full range of motion of all joints.   Neurologic:  alert & oriented X3 and gait normal.   Skin:  Intact without suspicious lesions or rashes Cervical Nodes:  No lymphadenopathy noted Axillary Nodes:  No palpable lymphadenopathy Psych:  Cognition and judgment appear intact. Alert and cooperative with normal attention span and concentration. No apparent delusions, illusions, hallucinations     Assessment & Plan:

## 2014-01-18 ENCOUNTER — Telehealth: Payer: Self-pay | Admitting: Family Medicine

## 2014-01-18 NOTE — Telephone Encounter (Signed)
Relevant patient education mailed to patient.  

## 2014-02-12 ENCOUNTER — Other Ambulatory Visit: Payer: Self-pay

## 2014-02-12 NOTE — Telephone Encounter (Signed)
Pt left v/m requesting adderall. Call when ready for pick up. Pt v/m included request to call pt to let Dr Dayton MartesAron know how Adderall was working. Called pt and no v/m available.

## 2014-02-13 MED ORDER — AMPHETAMINE-DEXTROAMPHETAMINE 10 MG PO TABS: 10.0000 mg | ORAL_TABLET | Freq: Two times a day (BID) | ORAL | Status: DC

## 2014-02-13 MED ORDER — AMPHETAMINE-DEXTROAMPHET ER 20 MG PO CP24: 20.0000 mg | ORAL_CAPSULE | Freq: Every day | ORAL | Status: DC

## 2014-02-13 NOTE — Telephone Encounter (Signed)
Spoke to pt and informed her of dose change and will pick up Rx

## 2014-02-13 NOTE — Telephone Encounter (Signed)
Spoke to pt and informed her Rx is available for pick up at the front desk 

## 2014-02-13 NOTE — Telephone Encounter (Signed)
Pt states that she crashes in the afternoon and is requesting to take 2tabs daily instead of having the extended relief.

## 2014-02-13 NOTE — Telephone Encounter (Signed)
Yes rx changed and printed.

## 2014-02-13 NOTE — Addendum Note (Signed)
Addended by: Dianne DunARON, Lemma Tetro M on: 02/13/2014 10:14 AM   Modules accepted: Orders, Medications

## 2014-02-15 ENCOUNTER — Encounter: Payer: Self-pay | Admitting: Family Medicine

## 2014-03-11 ENCOUNTER — Encounter: Payer: Self-pay | Admitting: Family Medicine

## 2014-03-26 ENCOUNTER — Other Ambulatory Visit: Payer: Self-pay | Admitting: Family Medicine

## 2014-03-26 NOTE — Telephone Encounter (Signed)
Ok to print and put in my box for pick up.

## 2014-03-26 NOTE — Telephone Encounter (Signed)
Pt requesting refill on Adderall XR because she is tired of taking 2 pills.  Adderall last filled 02/13/2014.

## 2014-03-27 MED ORDER — AMPHETAMINE-DEXTROAMPHET ER 20 MG PO CP24: 20.0000 mg | ORAL_CAPSULE | ORAL | Status: DC

## 2014-03-27 NOTE — Telephone Encounter (Signed)
Spoke to pt and informed her Rx will be available for pickup after 1300

## 2014-05-07 ENCOUNTER — Other Ambulatory Visit: Payer: Self-pay

## 2014-05-07 NOTE — Telephone Encounter (Signed)
Pt left v/m requesting rx for Adderall XR. Call when ready for pick up.  

## 2014-05-08 MED ORDER — AMPHETAMINE-DEXTROAMPHET ER 20 MG PO CP24: 20.0000 mg | ORAL_CAPSULE | ORAL | Status: DC

## 2014-05-08 NOTE — Telephone Encounter (Signed)
Lm on pts vm informing her Rx is available for pickup at the front desk 

## 2014-07-17 ENCOUNTER — Other Ambulatory Visit: Payer: Self-pay

## 2014-07-17 MED ORDER — AMPHETAMINE-DEXTROAMPHET ER 20 MG PO CP24: 20.0000 mg | ORAL_CAPSULE | ORAL | Status: DC

## 2014-07-17 NOTE — Telephone Encounter (Signed)
Pt left v/m requesting rx for Adderall XR. Call when ready for pick up.  

## 2014-07-18 NOTE — Telephone Encounter (Signed)
Spoke to pt and informed her Rx is available for pickup from the front desk 

## 2014-09-03 ENCOUNTER — Other Ambulatory Visit: Payer: Self-pay

## 2014-09-03 MED ORDER — AMPHETAMINE-DEXTROAMPHET ER 20 MG PO CP24: 20.0000 mg | ORAL_CAPSULE | ORAL | Status: DC

## 2014-09-03 NOTE — Telephone Encounter (Signed)
Spoke to pt and informed her Rx is available for pickup at the front desk; f/u appt scheduled as required for additional refills. Pt advised third party unable to pickup Rx

## 2014-09-03 NOTE — Telephone Encounter (Signed)
Pt left v/m requesting rx for Adderall XR. Call when ready for pick up.pt last seen 01/17/14. No future appt scheduled.

## 2014-09-05 ENCOUNTER — Encounter: Payer: Self-pay | Admitting: Family Medicine

## 2014-09-10 ENCOUNTER — Ambulatory Visit (INDEPENDENT_AMBULATORY_CARE_PROVIDER_SITE_OTHER): Payer: BC Managed Care – PPO | Admitting: Family Medicine

## 2014-09-10 ENCOUNTER — Encounter: Payer: Self-pay | Admitting: Family Medicine

## 2014-09-10 VITALS — BP 98/52 | HR 75 | Temp 98.1°F | Wt 120.5 lb

## 2014-09-10 DIAGNOSIS — F909 Attention-deficit hyperactivity disorder, unspecified type: Secondary | ICD-10-CM

## 2014-09-10 DIAGNOSIS — Z23 Encounter for immunization: Secondary | ICD-10-CM

## 2014-09-10 DIAGNOSIS — F988 Other specified behavioral and emotional disorders with onset usually occurring in childhood and adolescence: Secondary | ICD-10-CM

## 2014-09-10 MED ORDER — AMPHETAMINE-DEXTROAMPHETAMINE 5 MG PO TABS
ORAL_TABLET | ORAL | Status: DC
Start: 2014-09-10 — End: 2014-11-20

## 2014-09-10 NOTE — Progress Notes (Signed)
  Subjective:    Patient ID: Courtney Ruiz, female    DOB: 25-Jul-1986, 28 y.o.   MRN: [id number]  HPI 28 yo pleasant female here for follow up ADD.   ADD- diagnosed by formal psych eval Harle Battiest(julia Tabor)- still sees her.  Amil AmenJulia sent me a letter stating that that she test highly positive for ADD- 87%, and recommended stimulants in April.    Currently taking Adderall XR 20 mg daily.  Feels it works well until 2 pm and needs it to last longer.  No issues with insomnia.  Has noticed some decrease in appetite but she is "making" herself eat more.  "I do like to eat."  Wt Readings from Last 3 Encounters:  09/10/14 120 lb 8 oz (54.658 kg)  01/17/14 126 lb (57.153 kg)  04/27/12 118 lb (53.524 kg)     Patient Active Problem List   Diagnosis Date Noted  . ADD (attention deficit disorder) 01/17/2014  . Routine general medical examination at a health care facility 04/25/2012  . DEPRESSION/ANXIETY 05/07/2010   Past Medical History  Diagnosis Date  . Depression   . Anxiety   . Fatigue   . Routine general medical examination at a health care facility   . Screening for lipoid disorders    No past surgical history on file. History  Substance Use Topics  . Smoking status: Current Every Day Smoker -- 0.50 packs/day    Types: Cigarettes  . Smokeless tobacco: Not on file  . Alcohol Use: Yes     Comment: occ   Family History  Problem Relation Age of Onset  . Coronary artery disease Neg Hx    No Known Allergies Current Outpatient Prescriptions on File Prior to Visit  Medication Sig Dispense Refill  . amphetamine-dextroamphetamine (ADDERALL XR) 20 MG 24 hr capsule Take 1 capsule (20 mg total) by mouth every morning. 30 capsule 0  . valACYclovir (VALTREX) 1000 MG tablet 2 tablets by mouth two times a day x one day at first sign of mouth sore     No current facility-administered medications on file prior to visit.   The PMH, PSH, Social History, Family History, Medications, and allergies  [facility], and have been updated if relevant.   Review of Systems  Constitutional: Positive for appetite change.  HENT: Negative.   Respiratory: Negative.   Cardiovascular: Negative.   Gastrointestinal: Negative.   Musculoskeletal: Negative.   Skin: Negative.   Allergic/Immunologic: Negative.   Neurological: Negative.   Psychiatric/Behavioral: Positive for decreased concentration.  All other systems reviewed and are negative.  See HPI     Objective:   Physical Exam  Constitutional: She is oriented to person, place, and time. She appears well-developed and well-nourished. No distress.  Cardiovascular: Normal rate.   Pulmonary/Chest: Effort normal.  Neurological: She is alert and oriented to person, place, and time. No cranial nerve deficit.  Skin: Skin is warm and dry.  Psychiatric: She has a normal mood and affect. Her behavior is normal. Judgment and thought content normal.  Nursing note and vitals reviewed.  BP 98/52 mmHg  Pulse 75  Temp(Src) 98.1 F (36.7 C) (Oral)  Wt 120 lb 8 oz (54.658 kg)  SpO2 98%  Wt Readings from Last 3 Encounters:  09/10/14 120 lb 8 oz (54.658 kg)  01/17/14 126 lb (57.153 kg)  04/27/12 118 lb (53.524 kg)        Assessment & Plan:

## 2014-09-10 NOTE — Assessment & Plan Note (Addendum)
>  15 minutes spent in face to face time with patient, >50% spent in counselling or coordination of care. She is noticing affects of Adderall XR 20 mg daily wearing off in afternoon. Discussed options- she would like to continue current dose and add a low dose Adderall (5 mg ) immediate release in pm.  Advised not to take too late or she will have difficulty sleeping. Call or return to clinic prn if these symptoms worsen or fail to improve as anticipated. The patient indicates understanding of these issues and agrees with the plan.

## 2014-09-10 NOTE — Progress Notes (Signed)
Pre visit review using our clinic review tool, if applicable. No additional management support is needed unless otherwise documented below in the visit note. 

## 2014-10-28 ENCOUNTER — Other Ambulatory Visit: Payer: Self-pay | Admitting: *Deleted

## 2014-10-28 MED ORDER — AMPHETAMINE-DEXTROAMPHET ER 20 MG PO CP24: 20.0000 mg | ORAL_CAPSULE | ORAL | Status: DC

## 2014-10-28 NOTE — Telephone Encounter (Signed)
Patient left a voicemail requesting a refill on her Adderall XR. Last refill 09/03/14 #30. Please call when ready for pickup.

## 2014-10-28 NOTE — Telephone Encounter (Signed)
Spoke to pt and informed her Rx is available for pickup from the front desk 

## 2014-11-13 ENCOUNTER — Telehealth: Payer: Self-pay | Admitting: Family Medicine

## 2014-11-13 NOTE — Telephone Encounter (Signed)
Patient dropped off letter from insurance company regarding medication Dextroamphetamine-Amphet ER 20mg .  Please call back patient at [phone]. Placing on Estée LauderWaynetta's desk.

## 2014-11-13 NOTE — Telephone Encounter (Signed)
Form placed in Dr Elmer SowAron's inbox; attached list of approved meds per formulary

## 2014-11-20 ENCOUNTER — Telehealth: Payer: Self-pay | Admitting: Family Medicine

## 2014-11-20 MED ORDER — AMPHETAMINE-DEXTROAMPHET ER 20 MG PO CP24: 20.0000 mg | ORAL_CAPSULE | ORAL | Status: DC

## 2014-11-20 NOTE — Telephone Encounter (Signed)
Patient returned your call.

## 2014-11-20 NOTE — Addendum Note (Signed)
Addended by: Dianne DunARON, Kees Idrovo M on: 11/20/2014 11:36 AM   Modules accepted: Orders

## 2014-11-20 NOTE — Telephone Encounter (Signed)
Rx printed in error. Pt not due for refill until 11/29/14

## 2014-11-20 NOTE — Telephone Encounter (Signed)
Spoke to pt and advised her of non coverage of current medication. Pt states that she is agreeable to start whichever is closest to medication she is currently on. Refill not needed until 11/2014 per pt. Med list to be update with new med and dosing

## 2014-11-20 NOTE — Telephone Encounter (Signed)
See additional phone 

## 2014-11-20 NOTE — Telephone Encounter (Signed)
Rx printed

## 2014-11-20 NOTE — Telephone Encounter (Signed)
Lm on pts vm requesting a call back 

## 2014-11-20 NOTE — Telephone Encounter (Addendum)
Medications approved without PA include dextroamphetamine ER, dextroamphetamine-amphetamine, and methylphenidate. Pt is now current on medication and will not require refill until 11/29/2014. Med list only to be update with medication change

## 2014-12-05 ENCOUNTER — Other Ambulatory Visit: Payer: Self-pay

## 2014-12-05 MED ORDER — AMPHETAMINE-DEXTROAMPHET ER 20 MG PO CP24: 20.0000 mg | ORAL_CAPSULE | ORAL | Status: DC

## 2014-12-05 NOTE — Addendum Note (Signed)
Addended by: Dianne DunARON, Maxson Oddo M on: 12/05/2014 02:15 PM   Modules accepted: Orders

## 2014-12-05 NOTE — Telephone Encounter (Signed)
Pt left v/m requesting rx for Adderall. Call when ready for pick up. Pt last seen 09/10/14. Adderall rx LAST PRINTED 11/20/14.

## 2014-12-05 NOTE — Telephone Encounter (Signed)
Pt is actually due. You printed Rx in error on 2/24-see phone note. Medication list was only updated for new Rx that pts insurance will cover

## 2014-12-09 NOTE — Telephone Encounter (Signed)
Lm on pts vm informing her Rx is available for pickup from the front desk 

## 2014-12-12 ENCOUNTER — Telehealth: Payer: Self-pay

## 2014-12-12 NOTE — Telephone Encounter (Signed)
Pt left v/m that pharmacy advised pt they would fax over prior auth request for adderall. Pt request done ASAP so pt can pick up her med at pharmacy.pt request cb.

## 2014-12-13 MED ORDER — DEXTROAMPHETAMINE SULFATE ER 15 MG PO CP24: 15.0000 mg | ORAL_CAPSULE | Freq: Every day | ORAL | Status: DC

## 2014-12-13 NOTE — Telephone Encounter (Signed)
Rx entered.  Please print out and leave on my desk.

## 2014-12-13 NOTE — Telephone Encounter (Signed)
Patient called and asked to be called back about her medication.  Patient's out of medication.  Please call patient at  [phone].

## 2014-12-13 NOTE — Telephone Encounter (Signed)
Spoke to Courtney Ruiz and apologized to her. Unfortunately, Dr Dayton MartesAron had not chosen a medication that is covered by [employer] insurance company, but instead printed the old medication. Courtney Ruiz must choose between dextroamphetamine ER, dextroamphetamine-amphetamine, and methylphenidate. Courtney Ruiz is willing to have whichever is closest to what she was previously on, and Dr Dayton MartesAron thinks is best. Old medication has been removed from the pts med list to avoid reprinting of incorrect medication. Promised Courtney Ruiz that I will have Rx available for her first thing Monday morning as it will need to be signed off on by Dr Dayton MartesAron. Courtney Ruiz verbally expressed understanding and states she "will be ok without it over the weekend." please See 11/13/2014 phone note

## 2014-12-13 NOTE — Telephone Encounter (Signed)
Rx printed and awaiting signature.   

## 2014-12-16 NOTE — Telephone Encounter (Signed)
Spoke to pt and informed her Rx is available for pickup from the front desk 

## 2015-01-23 ENCOUNTER — Other Ambulatory Visit: Payer: Self-pay | Admitting: *Deleted

## 2015-01-23 MED ORDER — DEXTROAMPHETAMINE SULFATE ER 15 MG PO CP24: 15.0000 mg | ORAL_CAPSULE | Freq: Every day | ORAL | Status: DC

## 2015-01-23 NOTE — Telephone Encounter (Signed)
OV 09/10/14.  Last filled #30 12/13/14.

## 2015-01-23 NOTE — Telephone Encounter (Signed)
Lm on pts vm informing her Rx is available for pickup from the front desk 

## 2015-01-28 ENCOUNTER — Ambulatory Visit (INDEPENDENT_AMBULATORY_CARE_PROVIDER_SITE_OTHER): Payer: No Typology Code available for payment source | Admitting: Family Medicine

## 2015-01-28 ENCOUNTER — Encounter: Payer: Self-pay | Admitting: Family Medicine

## 2015-01-28 VITALS — BP 122/62 | HR 72 | Temp 97.9°F | Ht 61.0 in | Wt 124.8 lb

## 2015-01-28 DIAGNOSIS — Z114 Encounter for screening for human immunodeficiency virus [HIV]: Secondary | ICD-10-CM | POA: Diagnosis not present

## 2015-01-28 DIAGNOSIS — Z0000 Encounter for general adult medical examination without abnormal findings: Secondary | ICD-10-CM | POA: Diagnosis not present

## 2015-01-28 DIAGNOSIS — F909 Attention-deficit hyperactivity disorder, unspecified type: Secondary | ICD-10-CM | POA: Diagnosis not present

## 2015-01-28 DIAGNOSIS — F988 Other specified behavioral and emotional disorders with onset usually occurring in childhood and adolescence: Secondary | ICD-10-CM

## 2015-01-28 LAB — CBC WITH DIFFERENTIAL/PLATELET
BASOS ABS: 0 10*3/uL (ref 0.0–0.1)
Basophils Relative: 0.7 % (ref 0.0–3.0)
EOS ABS: 0.2 10*3/uL (ref 0.0–0.7)
Eosinophils Relative: 3.1 % (ref 0.0–5.0)
HCT: 45.8 % (ref 36.0–46.0)
Hemoglobin: 16 g/dL — ABNORMAL HIGH (ref 12.0–15.0)
LYMPHS ABS: 1.9 10*3/uL (ref 0.7–4.0)
LYMPHS PCT: 33.3 % (ref 12.0–46.0)
MCHC: 35 g/dL (ref 30.0–36.0)
MCV: 88.5 fl (ref 78.0–100.0)
Monocytes Absolute: 0.3 10*3/uL (ref 0.1–1.0)
Monocytes Relative: 5.7 % (ref 3.0–12.0)
Neutro Abs: 3.3 10*3/uL (ref 1.4–7.7)
Neutrophils Relative %: 57.2 % (ref 43.0–77.0)
Platelets: 261 10*3/uL (ref 150.0–400.0)
RBC: 5.17 Mil/uL — ABNORMAL HIGH (ref 3.87–5.11)
RDW: 12.8 % (ref 11.5–15.5)
WBC: 5.8 10*3/uL (ref 4.0–10.5)

## 2015-01-28 LAB — COMPREHENSIVE METABOLIC PANEL
ALT: 14 U/L (ref 0–35)
AST: 17 U/L (ref 0–37)
Albumin: 4.6 g/dL (ref 3.5–5.2)
Alkaline Phosphatase: 50 U/L (ref 39–117)
BUN: 16 mg/dL (ref 6–23)
CALCIUM: 9.9 mg/dL (ref 8.4–10.5)
CO2: 28 mEq/L (ref 19–32)
Chloride: 102 mEq/L (ref 96–112)
Creatinine, Ser: 0.93 mg/dL (ref 0.40–1.20)
GFR: 76.1 mL/min (ref >60.00)
Glucose, Bld: 80 mg/dL (ref 70–99)
POTASSIUM: 3.8 meq/L (ref 3.5–5.1)
Sodium: 135 mEq/L (ref 135–145)
Total Bilirubin: 0.5 mg/dL (ref 0.2–1.2)
Total Protein: 7.7 g/dL (ref 6.0–8.3)

## 2015-01-28 LAB — LIPID PANEL
CHOL/HDL RATIO: 3
Cholesterol: 205 mg/dL — ABNORMAL HIGH (ref 0–200)
HDL: 71.1 mg/dL (ref >39.00)
LDL Cholesterol: 124 mg/dL — ABNORMAL HIGH (ref 0–99)
NONHDL: 133.9
Triglycerides: 51 mg/dL (ref 0.0–149.0)
VLDL: 10.2 mg/dL (ref 0.0–40.0)

## 2015-01-28 LAB — TSH: TSH: 2.61 u[IU]/mL (ref 0.35–4.50)

## 2015-01-28 LAB — VITAMIN D 25 HYDROXY (VIT D DEFICIENCY, FRACTURES): VITD: 23.11 ng/mL — ABNORMAL LOW (ref 30.00–100.00)

## 2015-01-28 LAB — VITAMIN B12: Vitamin B-12: 198 pg/mL — ABNORMAL LOW (ref 211–911)

## 2015-01-28 NOTE — Progress Notes (Signed)
Pre visit review using our clinic review tool, if applicable. No additional management support is needed unless otherwise documented below in the visit note. 

## 2015-01-28 NOTE — Assessment & Plan Note (Signed)
Well controlled on current rx. No changes made today. 

## 2015-01-28 NOTE — Patient Instructions (Signed)
Great to see you. We will call you with your lab results and you can view them online.  

## 2015-01-28 NOTE — Progress Notes (Signed)
Subjective:    Patient ID: Courtney Ruiz, female    DOB: 1986/09/07, 29 y.o.   MRN: [id number]  HPI 29 yo healthy G4P2 here for CPX.   Had Mirena but now getting Depo shots at [facility]. Scheduled for pap smear in 01/2014. Last pap smear was done less than a year ago.  Sexually active with husband only.   Since she started stimulant, no longer anxious or depressed. Feels "great." Also has not had to take valtrex in over a year.       Patient Active Problem List   Diagnosis Date Noted  . ADD (attention deficit disorder) 01/17/2014   Past Medical History  Diagnosis Date  . Depression   . Anxiety   . Fatigue   . Routine general medical examination at a health care facility   . Screening for lipoid disorders    History reviewed. No pertinent past surgical history. History  Substance Use Topics  . Smoking status: Current Every Day Smoker -- 0.50 packs/day    Types: Cigarettes  . Smokeless tobacco: Not on file  . Alcohol Use: Yes     Comment: occ   Family History  Problem Relation Age of Onset  . Coronary artery disease Neg Hx    No Known Allergies Current Outpatient Prescriptions on File Prior to Visit  Medication Sig Dispense Refill  . dextroamphetamine (DEXEDRINE SPANSULE) 15 MG 24 hr capsule Take 1 capsule (15 mg total) by mouth daily. 30 capsule 0  . valACYclovir (VALTREX) 1000 MG tablet 2 tablets by mouth two times a day x one day at first sign of mouth sore     No current facility-administered medications on file prior to visit.   The PMH, PSH, Social History, Family History, Medications, and allergies have been reviewed in [facility], and have been updated if relevant.   Review of Systems  Constitutional: Negative.   HENT: Negative.   Eyes: Negative.   Respiratory: Negative.   Cardiovascular: Negative.   Gastrointestinal: Negative.   Endocrine: Negative.   Genitourinary: Negative.   Musculoskeletal: Negative.   Skin: Negative.    Allergic/Immunologic: Negative.   Neurological: Negative.   Hematological: Negative.   Psychiatric/Behavioral: Negative.   All other systems reviewed and are negative.   .       Objective:   Physical Exam BP 122/62 mmHg  Pulse 72  Temp(Src) 97.9 F (36.6 C) (Oral)  Ht  (1.549 m)  Wt 124 lb 12 oz (56.586 kg)  BMI 23.58 kg/m2  SpO2 97%  Wt Readings from Last 3 Encounters:  01/28/15 124 lb 12 oz (56.586 kg)  09/10/14 120 lb 8 oz (54.658 kg)  01/17/14 126 lb (57.153 kg)     General:  Well-developed,well-nourished,in no acute distress; alert,appropriate and cooperative throughout examination Head:  normocephalic and atraumatic.   Eyes:  vision grossly intact, pupils equal, pupils round, and pupils reactive to light.   Ears:  R ear normal and L ear normal.   Nose:  no external deformity.   Mouth:  good dentition.   Neck:  No deformities, masses, or tenderness noted..   Lungs:  Normal respiratory effort, chest expands symmetrically. Lungs are clear to auscultation, no crackles or wheezes. Heart:  Normal rate and regular rhythm. S1 and S2 normal without gallop, murmur, click, rub or other extra sounds. Abdomen:  Bowel sounds positive,abdomen soft and non-tender without masses, organomegaly or hernias noted. Msk:  No deformity or scoliosis noted of thoracic or lumbar spine.   Extremities:  No clubbing, cyanosis, edema, or deformity noted with normal full range of motion of all joints.   Neurologic:  alert & oriented X3 and gait normal.   Skin:  Intact without suspicious lesions or rashes Cervical Nodes:  No lymphadenopathy noted Axillary Nodes:  No palpable lymphadenopathy Psych:  Cognition and judgment appear intact. Alert and cooperative with normal attention span and concentration. No apparent delusions, illusions, hallucinations     Assessment & Plan:

## 2015-01-28 NOTE — Assessment & Plan Note (Signed)
Reviewed preventive care protocols, scheduled due services, and updated immunizations Discussed nutrition, exercise, diet, and healthy lifestyle.  Orders Placed This Encounter  Procedures  . CBC with Differential/Platelet  . Comprehensive metabolic panel  . Lipid panel  . TSH  . HIV antibody (with reflex)  . Vitamin B12  . Vitamin D, 25-hydroxy    

## 2015-01-29 LAB — HIV ANTIBODY (ROUTINE TESTING W REFLEX): HIV 1&2 Ab, 4th Generation: NONREACTIVE

## 2015-01-29 MED ORDER — VITAMIN D (ERGOCALCIFEROL) 1.25 MG (50000 UNIT) PO CAPS: 50000.0000 [IU] | ORAL_CAPSULE | ORAL | Status: DC

## 2015-01-29 NOTE — Addendum Note (Signed)
Addended by: Desmond DikeKNIGHT, Trenisha Lafavor H on: 01/29/2015 04:11 PM   Modules accepted: Orders

## 2015-02-05 ENCOUNTER — Encounter: Payer: Self-pay | Admitting: Family Medicine

## 2015-02-05 ENCOUNTER — Ambulatory Visit (INDEPENDENT_AMBULATORY_CARE_PROVIDER_SITE_OTHER): Payer: No Typology Code available for payment source | Admitting: Family Medicine

## 2015-02-05 VITALS — BP 114/66 | HR 79 | Temp 98.1°F | Wt 124.0 lb

## 2015-02-05 DIAGNOSIS — L989 Disorder of the skin and subcutaneous tissue, unspecified: Secondary | ICD-10-CM

## 2015-02-05 NOTE — Progress Notes (Signed)
Pre visit review using our clinic review tool, if applicable. No additional management support is needed unless otherwise documented below in the visit note. 

## 2015-02-05 NOTE — Assessment & Plan Note (Signed)
New- agree with pt that coloration does seem consistent with a bruise.  ? If she was wearing a bra or something with a button that pressed in that pattern.  She cannot recall this.  Advised to keep an eye on it and if it has not resolved or worsened in next 1-2 weeks or develops new lesions, needs to be seen ASAP. The patient indicates understanding of these issues and agrees with the plan.

## 2015-02-05 NOTE — Progress Notes (Signed)
Subjective:   Patient ID: Courtney BudSamantha R Sires, female    DOB: 09-20-1986, 29 y.o.   MRN: [id number]  Courtney Ruiz is a pleasant 29 y.o. year old female who presents to clinic today with bruise  on 02/05/2015  HPI:  Noticed what looked like a bruise on the back on her left shoulder/upper back a few days ago.  Non tender or raised.  She is here because she does not remember hitting her shoulder on anything.  [place] frequently[place].  Nothing she has ever been concerned about because she usually remembers how she gets those bruises.  Does not remember why she should have this bruise.  No blood in urine or stool. No nose bleeds.  Lab Results  Component Value Date   WBC 5.8 01/28/2015   HGB 16.0* 01/28/2015   HCT 45.8 01/28/2015   MCV 88.5 01/28/2015   PLT 261.0 01/28/2015    Current Outpatient Prescriptions on File Prior to Visit  Medication Sig Dispense Refill  . dextroamphetamine (DEXEDRINE SPANSULE) 15 MG 24 hr capsule Take 1 capsule (15 mg total) by mouth daily. 30 capsule 0  . valACYclovir (VALTREX) 1000 MG tablet 2 tablets by mouth two times a day x one day at first sign of mouth sore    . Vitamin D, Ergocalciferol, (DRISDOL) 50000 UNITS CAPS capsule Take 1 capsule (50,000 Units total) by mouth every 7 (seven) days. 6 capsule 0   No current facility-administered medications on file prior to visit.    No Known Allergies  Past Medical History  Diagnosis Date  . Depression   . Anxiety   . Fatigue   . Routine general medical examination at a health care facility   . Screening for lipoid disorders     History reviewed. No pertinent past surgical history.  Family History  Problem Relation Age of Onset  . Coronary artery disease Neg Hx     History   Social History  . Marital Status: Married    Spouse Name: N/A  . Number of Children: 2  . Years of Education: N/A   Occupational History  . dental assistant    Social  History Main Topics  . Smoking status: Current Every Day Smoker -- 0.50 packs/day    Types: Cigarettes  . Smokeless tobacco: Not on file  . Alcohol Use: Yes     Comment: occ  . Drug Use: No  . Sexual Activity: Not on file   Other Topics Concern  . Not on file   Social History Narrative   [place].  Husband is a Engineer, maintenancedairy farmer- Dr. Cyndie Chimeopland's patient   No regular exercise   The PMH, PSH, Social History, Family History, Medications, and allergies have been reviewed in [facility], and have been updated if relevant.     Review of Systems  Constitutional: Negative.   Respiratory: Negative.   Cardiovascular: Negative.   Gastrointestinal: Negative.   Genitourinary: Negative.   Skin: Positive for color change.  Allergic/Immunologic: Negative.   Neurological: Negative.   Hematological: Negative.   Psychiatric/Behavioral: Negative.   All other systems reviewed and are negative.      Objective:    BP 114/66 mmHg  Pulse 79  Temp(Src) 98.1 F (36.7 C) (Oral)  Wt 124 lb (56.246 kg)  SpO2 98%   Physical Exam  Constitutional: She is oriented to person, place, and time. She appears well-developed and well-nourished. No distress.  HENT:  Head: Normocephalic.  Eyes: Conjunctivae are normal.  Neck: Normal range of motion.  Cardiovascular: Normal rate.   Pulmonary/Chest: Effort normal.  Musculoskeletal: Normal range of motion.  Neurological: She is alert and oriented to person, place, and time. No cranial nerve deficit.  Skin: Skin is warm and dry.  Small (< .5 cm) semicircular appearing, non raised, subcutaneous lesion NTTP  Psychiatric: She has a normal mood and affect. Her behavior is normal. Judgment and thought content normal.  Nursing note and vitals reviewed.         Assessment & Plan:   Skin lesion of back No Follow-up on file.

## 2015-02-26 ENCOUNTER — Other Ambulatory Visit: Payer: Self-pay

## 2015-02-26 NOTE — Telephone Encounter (Signed)
Pt left v/m requesting rx for adderall extended release. Call when ready for pick up. Dextroamphetamine 15 mg 24 hr cap is on med list.Is that same med as adderall extended release. rx last printed 01/23/15 # 30 and pt last annual exam 01/28/2015.

## 2015-02-27 MED ORDER — DEXTROAMPHETAMINE SULFATE ER 15 MG PO CP24: 15.0000 mg | ORAL_CAPSULE | Freq: Every day | ORAL | Status: DC

## 2015-02-27 NOTE — Telephone Encounter (Signed)
LM on pts vm informing her Rx is available for pickup. Advised third party unable to pickup

## 2015-03-04 ENCOUNTER — Encounter: Payer: Self-pay | Admitting: Family Medicine

## 2015-03-24 ENCOUNTER — Other Ambulatory Visit: Payer: Self-pay | Admitting: Family Medicine

## 2015-03-24 DIAGNOSIS — E538 Deficiency of other specified B group vitamins: Secondary | ICD-10-CM | POA: Insufficient documentation

## 2015-03-24 DIAGNOSIS — E559 Vitamin D deficiency, unspecified: Secondary | ICD-10-CM | POA: Insufficient documentation

## 2015-03-28 ENCOUNTER — Encounter: Payer: Self-pay | Admitting: Family Medicine

## 2015-04-01 ENCOUNTER — Other Ambulatory Visit: Payer: No Typology Code available for payment source

## 2015-04-10 ENCOUNTER — Other Ambulatory Visit: Payer: Self-pay

## 2015-04-10 NOTE — Telephone Encounter (Signed)
Ok to print and put in my box for signature. 

## 2015-04-10 NOTE — Telephone Encounter (Signed)
Pt left v/m requesting refill adderall. Call when ready for pick up. rx last printed # 30 on 02/27/15; pt last seen on 01/28/15 for annual exam.

## 2015-04-15 MED ORDER — DEXTROAMPHETAMINE SULFATE ER 15 MG PO CP24: 15.0000 mg | ORAL_CAPSULE | Freq: Every day | ORAL | Status: DC

## 2015-04-15 NOTE — Telephone Encounter (Signed)
Lm on pts vm and informed her Rx is available for pickup from the front desk 

## 2015-05-21 ENCOUNTER — Other Ambulatory Visit: Payer: Self-pay

## 2015-05-21 NOTE — Telephone Encounter (Signed)
Ok to print out and put in my box for signature. 

## 2015-05-21 NOTE — Telephone Encounter (Signed)
Pt left v/m requesting rx for Adderall. Call when ready for pick up. rx last printed # 30 on 07/19//16 and last annual exam on 01/28/2015.

## 2015-05-22 MED ORDER — DEXTROAMPHETAMINE SULFATE ER 15 MG PO CP24: 15.0000 mg | ORAL_CAPSULE | Freq: Every day | ORAL | Status: DC

## 2015-05-22 NOTE — Telephone Encounter (Signed)
Spoke to pt and informed her Rx is available for pickup from the front desk 

## 2015-06-30 ENCOUNTER — Other Ambulatory Visit: Payer: Self-pay

## 2015-06-30 MED ORDER — DEXTROAMPHETAMINE SULFATE ER 15 MG PO CP24: 15.0000 mg | ORAL_CAPSULE | Freq: Every day | ORAL | Status: DC

## 2015-06-30 NOTE — Telephone Encounter (Signed)
Spoke to pt and informed her Rx is available for pickup.  

## 2015-06-30 NOTE — Telephone Encounter (Signed)
Pt left v/m requesting rx for Adderall. Call when ready for pick up. rx last printed # 30 on 05/22/15. Last seen annual exam 01/28/15.

## 2015-08-06 ENCOUNTER — Other Ambulatory Visit: Payer: Self-pay | Admitting: *Deleted

## 2015-08-06 MED ORDER — DEXTROAMPHETAMINE SULFATE ER 15 MG PO CP24: 15.0000 mg | ORAL_CAPSULE | Freq: Every day | ORAL | Status: DC

## 2015-08-06 NOTE — Telephone Encounter (Signed)
Ok to print out and put in my box for signature. 

## 2015-08-06 NOTE — Telephone Encounter (Signed)
Last filled #30 06/30/15

## 2015-08-07 NOTE — Telephone Encounter (Signed)
Spoke to pt and informed her Rx is available for pickup from the front desk 

## 2015-10-10 ENCOUNTER — Encounter: Payer: Self-pay | Admitting: Family Medicine

## 2015-10-10 ENCOUNTER — Ambulatory Visit (INDEPENDENT_AMBULATORY_CARE_PROVIDER_SITE_OTHER): Payer: BLUE CROSS/BLUE SHIELD | Admitting: Family Medicine

## 2015-10-10 VITALS — BP 102/64 | HR 88 | Temp 98.4°F | Ht 61.0 in | Wt 136.2 lb

## 2015-10-10 DIAGNOSIS — R0781 Pleurodynia: Secondary | ICD-10-CM | POA: Diagnosis not present

## 2015-10-10 MED ORDER — TRAMADOL HCL 50 MG PO TABS: 50.0000 mg | ORAL_TABLET | Freq: Three times a day (TID) | ORAL | Status: DC | PRN

## 2015-10-10 NOTE — Patient Instructions (Signed)
This is musculoskeletal in origin.  Continue the ibuprofen (800 mg every 8 hours as needed). Use heat as well.  Use the tramadol as needed for pain.  Follow up with Dr. Dayton MartesAron if you fail to improve.  Take care  Dr. Adriana Simasook

## 2015-10-10 NOTE — Progress Notes (Signed)
   Subjective:  Patient ID: Debby BudSamantha R Saiz, female    DOB: [birth year]  Age: 30 y.o. MRN: [id number]  CC: Rib pain  HPI:  30 year old female presents to the clinic today for an acute visit with complaints of rib pain.  Rib pain  Left lower rib.  Has been present intermittently since New years.   Pain is severe and sharp.  Worse in the am and with activity.  Mild improvement with ibuprofen.  No associated SOB.  No other complaints currently.   Social Hx   Social History   Social History  . Marital Status: Married    Spouse Name: N/A  . Number of Children: 2  . Years of Education: N/A   Occupational History  . dental assistant    Social History Main Topics  . Smoking status: Current Every Day Smoker -- 0.50 packs/day    Types: Cigarettes  . Smokeless tobacco: None  . Alcohol Use: Yes     Comment: occ  . Drug Use: No  . Sexual Activity: Not Asked   Other Topics Concern  . None   Social History Narrative   [place] 2 kids in [place].  Husband is a Engineer, maintenancedairy farmer- Dr. Cyndie Chimeopland's patient   No regular exercise   Review of Systems  Constitutional: Negative.   Respiratory: Negative for shortness of breath.     Objective:  BP 102/64 mmHg  Pulse 88  Temp(Src) 98.4 F (36.9 C) (Oral)  Ht 5\' 1"  (1.549 m)  Wt 136 lb 4 oz (61.803 kg)  BMI 25.76 kg/m2  SpO2 99%  BP/Weight 10/10/2015 02/05/2015 01/28/2015  Systolic BP 102 114 122  Diastolic BP 64 66 62  Wt. (Lbs) 136.25 124 124.75  BMI 25.76 23.44 23.58   Physical Exam  Constitutional: She is oriented to person, place, and time. She appears well-developed. No distress.  Cardiovascular: Normal rate and regular rhythm.   Pulmonary/Chest: Effort normal and breath sounds normal.  Musculoskeletal:  Left chest wall/lower ribs - focal area of tenderness in the mid-clavicular line (around rib 7-8).  Neurological: She is alert and oriented to person, place, and time.  Psychiatric: She has a normal  mood and affect.  Vitals reviewed.   Lab Results  Component Value Date   WBC 5.8 01/28/2015   HGB 16.0* 01/28/2015   HCT 45.8 01/28/2015   PLT 261.0 01/28/2015   GLUCOSE 80 01/28/2015   CHOL 205* 01/28/2015   TRIG 51.0 01/28/2015   HDL 71.10 01/28/2015   LDLCALC 124* 01/28/2015   ALT 14 01/28/2015   AST 17 01/28/2015   NA 135 01/28/2015   K 3.8 01/28/2015   CL 102 01/28/2015   CREATININE 0.93 01/28/2015   BUN 16 01/28/2015   CO2 28 01/28/2015   TSH 2.61 01/28/2015    Assessment & Plan:   Problem List Items Addressed This Visit    Rib pain - Primary    New problem. Treating with PRN Tramadol given no resolution with NSAID's. Advised close follow up with PCP.         Meds ordered this encounter  Medications  . traMADol (ULTRAM) 50 MG tablet    Sig: Take 1 tablet (50 mg total) by mouth every 8 (eight) hours as needed.    Dispense:  30 tablet    Refill:  0    Follow-up: PRN  Everlene OtherJayce Areebah Meinders DO [facility]

## 2015-10-10 NOTE — Progress Notes (Signed)
Pre visit review using our clinic review tool, if applicable. No additional management support is needed unless otherwise documented below in the visit note. 

## 2015-10-11 DIAGNOSIS — R0789 Other chest pain: Secondary | ICD-10-CM | POA: Insufficient documentation

## 2015-10-11 DIAGNOSIS — R0781 Pleurodynia: Secondary | ICD-10-CM | POA: Insufficient documentation

## 2015-10-11 NOTE — Assessment & Plan Note (Signed)
New problem. Treating with PRN Tramadol given no resolution with NSAID's. Advised close follow up with PCP.

## 2016-02-10 ENCOUNTER — Encounter: Payer: Self-pay | Admitting: Family Medicine

## 2016-02-10 ENCOUNTER — Ambulatory Visit (INDEPENDENT_AMBULATORY_CARE_PROVIDER_SITE_OTHER): Payer: BLUE CROSS/BLUE SHIELD | Admitting: Family Medicine

## 2016-02-10 VITALS — BP 102/66 | HR 104 | Temp 97.9°F | Ht 61.0 in | Wt 139.5 lb

## 2016-02-10 DIAGNOSIS — Z0000 Encounter for general adult medical examination without abnormal findings: Secondary | ICD-10-CM | POA: Diagnosis not present

## 2016-02-10 DIAGNOSIS — E559 Vitamin D deficiency, unspecified: Secondary | ICD-10-CM

## 2016-02-10 DIAGNOSIS — F988 Other specified behavioral and emotional disorders with onset usually occurring in childhood and adolescence: Secondary | ICD-10-CM

## 2016-02-10 DIAGNOSIS — E538 Deficiency of other specified B group vitamins: Secondary | ICD-10-CM

## 2016-02-10 DIAGNOSIS — Z01419 Encounter for gynecological examination (general) (routine) without abnormal findings: Secondary | ICD-10-CM

## 2016-02-10 DIAGNOSIS — F909 Attention-deficit hyperactivity disorder, unspecified type: Secondary | ICD-10-CM

## 2016-02-10 LAB — COMPREHENSIVE METABOLIC PANEL
ALK PHOS: 53 U/L (ref 39–117)
ALT: 21 U/L (ref 0–35)
AST: 21 U/L (ref 0–37)
Albumin: 4.5 g/dL (ref 3.5–5.2)
BILIRUBIN TOTAL: 0.5 mg/dL (ref 0.2–1.2)
BUN: 17 mg/dL (ref 6–23)
CALCIUM: 9.8 mg/dL (ref 8.4–10.5)
CO2: 30 mEq/L (ref 19–32)
Chloride: 102 mEq/L (ref 96–112)
Creatinine, Ser: 0.86 mg/dL (ref 0.40–1.20)
GFR: 82.69 mL/min (ref >60.00)
GLUCOSE: 84 mg/dL (ref 70–99)
POTASSIUM: 4.2 meq/L (ref 3.5–5.1)
Sodium: 137 mEq/L (ref 135–145)
TOTAL PROTEIN: 7.1 g/dL (ref 6.0–8.3)

## 2016-02-10 LAB — CBC WITH DIFFERENTIAL/PLATELET
Basophils Absolute: 0 10*3/uL (ref 0.0–0.1)
Basophils Relative: 0.6 % (ref 0.0–3.0)
EOS PCT: 3 % (ref 0.0–5.0)
Eosinophils Absolute: 0.2 10*3/uL (ref 0.0–0.7)
HEMATOCRIT: 45 % (ref 36.0–46.0)
Hemoglobin: 15.4 g/dL — ABNORMAL HIGH (ref 12.0–15.0)
LYMPHS PCT: 32.1 % (ref 12.0–46.0)
Lymphs Abs: 2.1 10*3/uL (ref 0.7–4.0)
MCHC: 34.2 g/dL (ref 30.0–36.0)
MCV: 90 fl (ref 78.0–100.0)
MONOS PCT: 5.7 % (ref 3.0–12.0)
Monocytes Absolute: 0.4 10*3/uL (ref 0.1–1.0)
NEUTROS ABS: 3.8 10*3/uL (ref 1.4–7.7)
NEUTROS PCT: 58.6 % (ref 43.0–77.0)
PLATELETS: 291 10*3/uL (ref 150.0–400.0)
RBC: 5 Mil/uL (ref 3.87–5.11)
RDW: 12.6 % (ref 11.5–15.5)
WBC: 6.4 10*3/uL (ref 4.0–10.5)

## 2016-02-10 LAB — LIPID PANEL
CHOL/HDL RATIO: 3
Cholesterol: 236 mg/dL — ABNORMAL HIGH (ref 0–200)
HDL: 72.7 mg/dL (ref >39.00)
LDL CALC: 150 mg/dL — AB (ref 0–99)
NONHDL: 163.15
Triglycerides: 67 mg/dL (ref 0.0–149.0)
VLDL: 13.4 mg/dL (ref 0.0–40.0)

## 2016-02-10 LAB — VITAMIN B12: Vitamin B-12: 322 pg/mL (ref 211–911)

## 2016-02-10 LAB — VITAMIN D 25 HYDROXY (VIT D DEFICIENCY, FRACTURES): VITD: 24.46 ng/mL — ABNORMAL LOW (ref 30.00–100.00)

## 2016-02-10 LAB — TSH: TSH: 3.19 u[IU]/mL (ref 0.35–4.50)

## 2016-02-10 MED ORDER — AMPHETAMINE-DEXTROAMPHETAMINE 10 MG PO TABS: 10.0000 mg | ORAL_TABLET | Freq: Two times a day (BID) | ORAL | Status: AC

## 2016-02-10 NOTE — Assessment & Plan Note (Signed)
Deteriorated.  Trial of low dose short acting Adderall twice daily. Rx printed and given to pt. Call or return to clinic prn if these symptoms worsen or fail to improve as anticipated. The patient indicates understanding of these issues and agrees with the plan.

## 2016-02-10 NOTE — Progress Notes (Signed)
Subjective:    Patient ID: Courtney Ruiz, female    DOB: 12-Oct-1985, 30 y.o.   MRN: [id number]  HPI 30 yo healthy G4P2 here for CPX.   Had Mirena but now getting Depo shots at [facility].  Last pap smear done within this year.   Sexually active with husband only.   ADD-  Initially stimulant worked well but then started having trouble sleeping so stopped taking it.  Now feeling fatigued and less focused.     Lab Results  Component Value Date   CHOL 205* 01/28/2015   HDL 71.10 01/28/2015   LDLCALC 124* 01/28/2015   TRIG 51.0 01/28/2015   CHOLHDL 3 01/28/2015   Lab Results  Component Value Date   CREATININE 0.93 01/28/2015   Lab Results  Component Value Date   TSH 2.61 01/28/2015   Lab Results  Component Value Date   WBC 5.8 01/28/2015   HGB 16.0* 01/28/2015   HCT 45.8 01/28/2015   MCV 88.5 01/28/2015   PLT 261.0 01/28/2015    Patient Active Problem List   Diagnosis Date Noted  . Well woman exam 02/10/2016  . Vitamin D deficiency 03/24/2015  . Vitamin B12 deficiency 03/24/2015  . ADD (attention deficit disorder) 01/17/2014   Past Medical History  Diagnosis Date  . Depression   . Anxiety   . Fatigue   . Routine general medical examination at a health care facility   . Screening for lipoid disorders    No past surgical history on file. Social History  Substance Use Topics  . Smoking status: Current Every Day Smoker -- 0.50 packs/day    Types: Cigarettes  . Smokeless tobacco: None  . Alcohol Use: Yes     Comment: occ   Family History  Problem Relation Age of Onset  . Coronary artery disease Neg Hx    Allergies  Allergen Reactions  . Tape Other (See Comments)    sensitivity   Current Outpatient Prescriptions on File Prior to Visit  Medication Sig Dispense Refill  . cyanocobalamin 1000 MCG tablet Take 100 mcg by mouth daily.     No current facility-administered medications on file prior to visit.   The PMH, PSH, Social History, Family  History, Medications, and allergies have been reviewed in [facility], and have been updated if relevant.   Review of Systems  Constitutional: Positive for fatigue.  HENT: Negative.   Eyes: Negative.   Respiratory: Negative.   Cardiovascular: Negative.   Gastrointestinal: Negative.   Endocrine: Negative.   Genitourinary: Negative.   Musculoskeletal: Negative.   Skin: Negative.   Allergic/Immunologic: Negative.   Neurological: Negative.   Hematological: Negative.   Psychiatric/Behavioral: Positive for decreased concentration. Negative for suicidal ideas, hallucinations, sleep disturbance, self-injury and dysphoric mood. The patient is not nervous/anxious and is not hyperactive.   All other systems reviewed and are negative.   .       Objective:   Physical Exam BP 102/66 mmHg  Pulse 104  Temp(Src) 97.9 F (36.6 C) (Oral)  Ht  (1.549 m)  Wt 139 lb 8 oz (63.277 kg)  BMI 26.37 kg/m2  SpO2 97%  LMP 02/04/2016  Wt Readings from Last 3 Encounters:  02/10/16 139 lb 8 oz (63.277 kg)  10/10/15 136 lb 4 oz (61.803 kg)  02/05/15 124 lb (56.246 kg)     General:  Well-developed,well-nourished,in no acute distress; alert,appropriate and cooperative throughout examination Head:  normocephalic and atraumatic.   Eyes:  vision grossly intact, pupils equal, pupils round,  and pupils reactive to light.   Ears:  R ear normal and L ear normal.   Nose:  no external deformity.   Mouth:  good dentition.   Neck:  No deformities, masses, or tenderness noted..   Lungs:  Normal respiratory effort, chest expands symmetrically. Lungs are clear to auscultation, no crackles or wheezes. Heart:  Normal rate and regular rhythm. S1 and S2 normal without gallop, murmur, click, rub or other extra sounds. Abdomen:  Bowel sounds positive,abdomen soft and non-tender without masses, organomegaly or hernias noted. Msk:  No deformity or scoliosis noted of thoracic or lumbar spine.   Extremities:  No clubbing,  cyanosis, edema, or deformity noted with normal full range of motion of all joints.   Neurologic:  alert & oriented X3 and gait normal.   Skin:  Intact without suspicious lesions or rashes Cervical Nodes:  No lymphadenopathy noted Axillary Nodes:  No palpable lymphadenopathy Psych:  Cognition and judgment appear intact. Alert and cooperative with normal attention span and concentration. No apparent delusions, illusions, hallucinations     Assessment & Plan:

## 2016-02-10 NOTE — Addendum Note (Signed)
Addended by: Jodean Valade C on: 02/10/2016 03:57 PM   Modules accepted: SmartSet  

## 2016-02-10 NOTE — Progress Notes (Signed)
Pre visit review using our clinic review tool, if applicable. No additional management support is needed unless otherwise documented below in the visit note. 

## 2016-02-10 NOTE — Assessment & Plan Note (Signed)
Reviewed preventive care protocols, scheduled due services, and updated immunizations Discussed nutrition, exercise, diet, and healthy lifestyle.  

## 2016-02-11 ENCOUNTER — Other Ambulatory Visit: Payer: Self-pay

## 2016-02-11 MED ORDER — VITAMIN D (ERGOCALCIFEROL) 1.25 MG (50000 UNIT) PO CAPS: 50000.0000 [IU] | ORAL_CAPSULE | ORAL | Status: DC

## 2016-05-14 NOTE — H&P (Signed)
Courtney Ruiz is a 30 y.o. female here forsuction D+C. G4P2 14+3 LMP 01/28/16 with u/s on 8/16 showing a CRL of 13 weeks and no FHR. PT Is interested in a dilation and curettage . blod type O neg. No bleeding  Past Medical History:  has a past medical history of ADHD (attention deficit hyperactivity disorder).  Past Surgical History:  has no past surgical history on file. Family History: family history includes Breast cancer in her maternal aunt and maternal grandmother; Diabetes mellitus in her maternal grandmother; Hypertension in her mother. Social History:  reports that she has quit smoking. She smoked 0.25 packs per day. She has never used smokeless tobacco. She reports that she drinks alcohol. She reports that she does not use illicit drugs. OB/GYN History:  OB History    Gravida Para Term Preterm AB Living   4 2 2  0 1 2   SAB TAB Ectopic Multiple Live Births   1 0 0 0 2      Obstetric Comments   Threatened PTL with her 1st baby - requiring BMZ during pregnancy  possible vacuum-assisted delivery with her first pregnancy, but pt. is unsure      Allergies: is allergic to adhesive tape-silicones. Medications:  Current Outpatient Prescriptions:  .  multivitamin tablet, Take 1 tablet by mouth once daily., Disp: , Rfl:  .  sertraline (ZOLOFT) 50 MG tablet, Take 1 tablet (50 mg total) by mouth once daily. Take 1/2 tablet (25mg ) daily for the first 7 days.  Then increase to 1 tablet (50mg ) daily., Disp: 30 tablet, Rfl: 6  Review of Systems: General:                                          No fatigue or weight loss Eyes:                                                         No vision changes Ears:                                                          No hearing difficulty Respiratory:                No cough or shortness of breath Pulmonary:                                      No asthma or shortness of breath Cardiovascular:                     No chest pain, palpitations,  dyspnea on exertion Gastrointestinal:                    No abdominal bloating, chronic diarrhea, constipations, masses, pain or hematochezia Genitourinary:  No hematuria, dysuria, abnormal vaginal discharge, pelvic pain, Menometrorrhagia Lymphatic:                                       No swollen lymph nodes Musculoskeletal:                   No muscle weakness Neurologic:                                      No extremity weakness, syncope, seizure disorder Psychiatric:                                      No history of depression, delusions or suicidal/homicidal ideation    Exam:      Vitals:   05/14/16 1449  BP: 101/71  Pulse: 91    Body mass index is 25.9 kg/(m^2).  WDWN white/  female in NAD   Lungs: CTA  CV : RRR without murmur    Neck:  no thyromegaly Abdomen: soft , no mass, normal active bowel sounds,  non-tender, no rebound tenderness Pelvic: tanner stage 5 ,  External genitalia: vulva /labia no lesions Urethra: no prolapse Vagina: normal physiologic d/c Cervix: no lesions, no cervical motion tenderness   Uterus: normal size shape and contour, non-tender Adnexa: no mass,  non-tender     Impression:   The encounter diagnosis was Missed abortion.    Plan:   Spoke to her about cytotec vaginal for home expulsion vs suction D+C . Pt elects for the latter . Pt will need rhogam at the time of surgery  Benefits and risks to surgery: The proposed benefit of the surgery has been discussed with the patient. The possible risks include, but are not limited to: organ injury to the bowel , bladder, ureters, and major blood vessels and nerves. There is a possibility of additional surgeries resulting from these injuries. There is also the risk of blood transfusion and the need to receive blood products during or after the procedure which may rarely lead to HIV or Hepatitis C infection. There is a risk of developing a deep venous  thrombosis or a pulmonary embolism . There is the possibility of wound infection and also anesthetic complications, even the rare possibility of death. The patient understands these risks and wishes to proceed. All questions have been answered and the consent has been signed.    Return in about 2 weeks (around 05/28/2016) for postop.  Vilma PraderHOMAS JANSE Avinash Maltos, MD           Office Visit on 05/14/2016      Department  Name Address Phone Fax  [facility] [address] [place] [phone]  Service Location  Name Address      [facility] [address] [place] [address]

## 2016-05-17 ENCOUNTER — Encounter
Admission: RE | Disposition: A | Discharge status: Home / Self Care | Payer: Self-pay | Source: Ambulatory Visit | Attending: Obstetrics and Gynecology

## 2016-05-17 ENCOUNTER — Ambulatory Visit: Payer: BLUE CROSS/BLUE SHIELD | Admitting: Certified Registered Nurse Anesthetist

## 2016-05-17 ENCOUNTER — Encounter: Payer: Self-pay | Admitting: *Deleted

## 2016-05-17 ENCOUNTER — Ambulatory Visit
Admission: RE | Admit: 2016-05-17 | Discharge: 2016-05-17 | Disposition: A | Discharge status: Home / Self Care | Payer: BLUE CROSS/BLUE SHIELD | Source: Ambulatory Visit | Attending: Obstetrics and Gynecology | Admitting: Obstetrics and Gynecology

## 2016-05-17 DIAGNOSIS — O99341 Other mental disorders complicating pregnancy, first trimester: Secondary | ICD-10-CM | POA: Insufficient documentation

## 2016-05-17 DIAGNOSIS — Z87891 Personal history of nicotine dependence: Secondary | ICD-10-CM | POA: Diagnosis not present

## 2016-05-17 DIAGNOSIS — F909 Attention-deficit hyperactivity disorder, unspecified type: Secondary | ICD-10-CM | POA: Insufficient documentation

## 2016-05-17 DIAGNOSIS — Z91048 Other nonmedicinal substance allergy status: Secondary | ICD-10-CM | POA: Insufficient documentation

## 2016-05-17 DIAGNOSIS — Z3A13 13 weeks gestation of pregnancy: Secondary | ICD-10-CM | POA: Insufficient documentation

## 2016-05-17 DIAGNOSIS — Z79899 Other long term (current) drug therapy: Secondary | ICD-10-CM | POA: Diagnosis not present

## 2016-05-17 DIAGNOSIS — O021 Missed abortion: Secondary | ICD-10-CM | POA: Diagnosis present

## 2016-05-17 HISTORY — DX: Nausea with vomiting, unspecified: R11.2

## 2016-05-17 HISTORY — DX: Nausea with vomiting, unspecified: Z98.890

## 2016-05-17 HISTORY — PX: DILATION AND EVACUATION: SHX1459

## 2016-05-17 LAB — CBC
HCT: 39.6 % (ref 35.0–47.0)
HEMOGLOBIN: 14.1 g/dL (ref 12.0–16.0)
MCH: 31.5 pg (ref 26.0–34.0)
MCHC: 35.7 g/dL (ref 32.0–36.0)
MCV: 88.2 fL (ref 80.0–100.0)
Platelets: 247 10*3/uL (ref 150–440)
RBC: 4.48 MIL/uL (ref 3.80–5.20)
RDW: 13.3 % (ref 11.5–14.5)
WBC: 8.3 10*3/uL (ref 3.6–11.0)

## 2016-05-17 LAB — BASIC METABOLIC PANEL
Anion gap: 5 (ref 5–15)
BUN: 9 mg/dL (ref 6–20)
CHLORIDE: 105 mmol/L (ref 101–111)
CO2: 25 mmol/L (ref 22–32)
CREATININE: 0.58 mg/dL (ref 0.44–1.00)
Calcium: 9.2 mg/dL (ref 8.9–10.3)
GFR calc Af Amer: >60 mL/min (ref >60)
GFR calc non Af Amer: >60 mL/min (ref >60)
GLUCOSE: 82 mg/dL (ref 65–99)
POTASSIUM: 3.7 mmol/L (ref 3.5–5.1)
SODIUM: 135 mmol/L (ref 135–145)

## 2016-05-17 LAB — TYPE AND SCREEN
ABO/RH(D): O NEG
Antibody Screen: NEGATIVE

## 2016-05-17 SURGERY — DILATION AND EVACUATION, UTERUS, SECOND TRIMESTER
Anesthesia: General

## 2016-05-17 MED ORDER — DEXAMETHASONE SODIUM PHOSPHATE 10 MG/ML IJ SOLN
INTRAMUSCULAR | Status: DC | PRN
  Administered 2016-05-17: 10 mg via INTRAVENOUS

## 2016-05-17 MED ORDER — METHYLERGONOVINE MALEATE 0.2 MG/ML IJ SOLN
INTRAMUSCULAR | Status: DC | PRN
  Administered 2016-05-17: 0.2 mg via INTRAMUSCULAR

## 2016-05-17 MED ORDER — MIDAZOLAM HCL 2 MG/2ML IJ SOLN
INTRAMUSCULAR | Status: DC | PRN
  Administered 2016-05-17: 2 mg via INTRAVENOUS

## 2016-05-17 MED ORDER — LIDOCAINE HCL (CARDIAC) 20 MG/ML IV SOLN
INTRAVENOUS | Status: DC | PRN
  Administered 2016-05-17: 60 mg via INTRAVENOUS

## 2016-05-17 MED ORDER — PROMETHAZINE HCL 25 MG/ML IJ SOLN
INTRAMUSCULAR | Status: AC
  Administered 2016-05-17: 12.5 mg via INTRAVENOUS
  Filled 2016-05-17: qty 1

## 2016-05-17 MED ORDER — LACTATED RINGERS IV SOLN
INTRAVENOUS | Status: DC
  Administered 2016-05-17: 1000 mL via INTRAVENOUS
  Administered 2016-05-17: 11:00:00 via INTRAVENOUS

## 2016-05-17 MED ORDER — PROPOFOL 10 MG/ML IV BOLUS
INTRAVENOUS | Status: DC | PRN
  Administered 2016-05-17: 180 mg via INTRAVENOUS

## 2016-05-17 MED ORDER — SOD CITRATE-CITRIC ACID 500-334 MG/5ML PO SOLN: 30.0000 mL | ORAL | Status: DC

## 2016-05-17 MED ORDER — OXYCODONE HCL 5 MG PO TABS: 5.0000 mg | ORAL_TABLET | Freq: Once | ORAL | Status: DC | PRN

## 2016-05-17 MED ORDER — RHO D IMMUNE GLOBULIN 1500 UNIT/2ML IJ SOSY
300.0000 ug | PREFILLED_SYRINGE | Freq: Once | INTRAMUSCULAR | Status: AC
  Administered 2016-05-17: 300 ug via INTRAMUSCULAR
  Filled 2016-05-17: qty 2

## 2016-05-17 MED ORDER — METHYLERGONOVINE MALEATE 0.2 MG/ML IJ SOLN
INTRAMUSCULAR | Status: AC
  Filled 2016-05-17: qty 1

## 2016-05-17 MED ORDER — OXYCODONE HCL 5 MG/5ML PO SOLN: 5.0000 mg | Freq: Once | ORAL | Status: DC | PRN

## 2016-05-17 MED ORDER — ONDANSETRON HCL 4 MG/2ML IJ SOLN
INTRAMUSCULAR | Status: DC | PRN
  Administered 2016-05-17: 4 mg via INTRAVENOUS

## 2016-05-17 MED ORDER — SODIUM CHLORIDE 0.9 % IJ SOLN
INTRAMUSCULAR | Status: AC
  Filled 2016-05-17: qty 10

## 2016-05-17 MED ORDER — FENTANYL CITRATE (PF) 100 MCG/2ML IJ SOLN
INTRAMUSCULAR | Status: DC | PRN
  Administered 2016-05-17 (×3): 25 ug via INTRAVENOUS

## 2016-05-17 MED ORDER — FENTANYL CITRATE (PF) 100 MCG/2ML IJ SOLN
INTRAMUSCULAR | Status: AC
  Administered 2016-05-17: 50 ug via INTRAVENOUS
  Filled 2016-05-17: qty 2

## 2016-05-17 MED ORDER — SILVER NITRATE-POT NITRATE 75-25 % EX MISC
CUTANEOUS | Status: AC
  Filled 2016-05-17: qty 4

## 2016-05-17 MED ORDER — PROMETHAZINE HCL 25 MG/ML IJ SOLN
12.5000 mg | Freq: Once | INTRAMUSCULAR | Status: AC
  Administered 2016-05-17: 12.5 mg via INTRAVENOUS

## 2016-05-17 MED ORDER — FENTANYL CITRATE (PF) 100 MCG/2ML IJ SOLN
25.0000 ug | INTRAMUSCULAR | Status: DC | PRN
  Administered 2016-05-17 (×2): 50 ug via INTRAVENOUS

## 2016-05-17 SURGICAL SUPPLY — 21 items
CATH ROBINSON RED A/P 16FR (CATHETERS) ×3 IMPLANT
FILTER UTR ASPR SPEC (MISCELLANEOUS) ×1 IMPLANT
FLTR UTR ASPR SPEC (MISCELLANEOUS) ×3
GLOVE BIO SURGEON STRL SZ8 (GLOVE) ×3 IMPLANT
GOWN STRL REUS W/ TWL LRG LVL3 (GOWN DISPOSABLE) ×1 IMPLANT
GOWN STRL REUS W/ TWL XL LVL3 (GOWN DISPOSABLE) ×1 IMPLANT
GOWN STRL REUS W/TWL LRG LVL3 (GOWN DISPOSABLE) ×2
GOWN STRL REUS W/TWL XL LVL3 (GOWN DISPOSABLE) ×2
KIT RM TURNOVER CYSTO AR (KITS) ×3 IMPLANT
PACK DNC HYST (MISCELLANEOUS) ×3 IMPLANT
PAD OB MATERNITY 4.3X12.25 (PERSONAL CARE ITEMS) ×3 IMPLANT
PAD PREP 24X41 OB/GYN DISP (PERSONAL CARE ITEMS) ×3 IMPLANT
SET BERKELEY SUCTION TUBING (SUCTIONS) ×3 IMPLANT
TOWEL OR 17X26 4PK STRL BLUE (TOWEL DISPOSABLE) ×3 IMPLANT
VACURETTE 10 RIGID CVD (CANNULA) ×3 IMPLANT
VACURETTE 12 RIGID CVD (CANNULA) ×3 IMPLANT
VACURETTE 6 ASPIR F TIP BERK (CANNULA) IMPLANT
VACURETTE 7MM F TIP (CANNULA) ×2
VACURETTE 7MM F TIP STRL (CANNULA) ×1 IMPLANT
VACURETTE 8 RIGID CVD (CANNULA) IMPLANT
VACURETTE 8MM F TIP (MISCELLANEOUS) ×6 IMPLANT

## 2016-05-17 NOTE — Anesthesia Procedure Notes (Signed)
Procedure Name: LMA Insertion Performed by: Jaelen Gellerman Pre-anesthesia Checklist: Patient identified, Patient being monitored, Timeout performed, Emergency Drugs available and Suction available Patient Re-evaluated:Patient Re-evaluated prior to inductionOxygen Delivery Method: Circle system utilized Preoxygenation: Pre-oxygenation with 100% oxygen Intubation Type: IV induction Ventilation: Mask ventilation without difficulty LMA: LMA inserted LMA Size: 4.0 Tube type: Oral Number of attempts: 1 Placement Confirmation: positive ETCO2 and breath sounds checked- equal and bilateral Tube secured with: Tape Dental Injury: Teeth and Oropharynx as per pre-operative assessment        

## 2016-05-17 NOTE — Progress Notes (Signed)
Pt ready for surgery . Rhogam given . NPO

## 2016-05-17 NOTE — Transfer of Care (Signed)
Immediate Anesthesia Transfer of Care Note  Patient: Courtney Ruiz  Procedure(s) Performed: Procedure(s): DILATATION AND EVACUATION (D&E) 2ND TRIMESTER (N/A)  Patient Location: PACU  Anesthesia Type:General  Level of Consciousness: sedated  Airway & Oxygen Therapy: Patient Spontanous Breathing and Patient connected to face mask oxygen  Post-op Assessment: Report given to RN and Post -op Vital signs reviewed and stable  Post vital signs: Reviewed and stable  Last Vitals:  Vitals:   05/17/16 0813  BP: 113/62  Pulse: 71  Resp: 16  Temp: 36.8 C    Last Pain:  Vitals:   05/17/16 0813  TempSrc: Oral         Complications: No apparent anesthesia complications

## 2016-05-17 NOTE — Op Note (Signed)
[place]:  Courtney Ruiz, Courtney Ruiz             ACCOUNT NO.:  [id number]  MEDICAL RECORD NO.:  [id number]  LOCATION:  [facility]                         FACILITY:  [facility]  PHYSICIAN:  Jennell Cornerhomas Tecia Cinnamon, MDDATE OF BIRTH:  1986-05-04  DATE OF PROCEDURE: DATE OF DISCHARGE:  05/17/2016                              OPERATIVE REPORT   PREOPERATIVE DIAGNOSIS:  Missed abortion.  POSTOPERATIVE DIAGNOSIS:  Missed abortion.  PROCEDURE PERFORMED:  Dilation and evacuation.  SURGEON:  Jennell Cornerhomas Harrington Jobe, MD  ANESTHESIA:  General endotracheal anesthesia.  SURGEON:  Jennell Cornerhomas Romani Wilbon, MD  INDICATIONS:  This is a 30 year old, gravida 4, para 2 patient at 14+ 3 weeks estimated gestational age noted to have intrauterine demise approximately 13 weeks.  No fetal heart motion noted.  DESCRIPTION OF PROCEDURE:  After adequate general endotracheal anesthesia, patient was placed in dorsal supine position.  Legs placed in candy-cane stirrups.  The patient's abdomen and vagina were prepped and draped in normal sterile fashion.  Time-out was performed.  A weighted speculum was placed in the posterior vaginal vault and the anterior cervix was grasped with a single-tooth tenaculum.  Straight catheterization of the bladder yielded 300 mL clear urine.  Cervix was dilated to #20 Hanks dilator followed by dilation with Hegar dilators #10.  Rigid suction curette was placed into the endometrial cavity and large amount of tissue and blood were removed.  Sharp curettage performed and polyp forceps were used to tease out of fetal parts, continued bleeding persisted, and a #12 rigid curette was placed with additional tissue.  Ultimately, good hemostasis was obtained and large amounts of products of conception were removed.  Sharp curettage was performed to the end with good uterine cry.  Good hemostasis noted at the end of the case.  The patient did receive 0.2 mg of IM Methergine. There were no complications.  ESTIMATED  BLOOD LOSS:  800 mL.  INTRAOPERATIVE FLUIDS:  800 mL.  Patient tolerated the procedure well, and was taken to recovery room in good condition.          ______________________________ Jennell Cornerhomas Imagene Boss, MD     TS/MEDQ  D:  05/17/2016  T:  05/17/2016  Job:  [id number]

## 2016-05-17 NOTE — Anesthesia Preprocedure Evaluation (Signed)
Anesthesia Evaluation  Patient identified by MRN, date of birth, ID band Patient awake    Reviewed: Allergy & Precautions, H&P , NPO status , Patient's Chart, lab work & pertinent test results  History of Anesthesia Complications (+) Family history of anesthesia reaction and history of anesthetic complications  Airway Mallampati: II  TM Distance: >3 FB Neck ROM: full    Dental no notable dental hx. (+) Teeth Intact   Pulmonary neg shortness of breath, former smoker,    Pulmonary exam normal breath sounds clear to auscultation       Cardiovascular Exercise Tolerance: Good (-) angina(-) Past MI and (-) DOE negative cardio ROS Normal cardiovascular exam Rhythm:regular Rate:Normal     Neuro/Psych PSYCHIATRIC DISORDERS Anxiety Depression negative neurological ROS     GI/Hepatic negative GI ROS, Neg liver ROS,   Endo/Other  negative endocrine ROS  Renal/GU      Musculoskeletal   Abdominal   Peds  Hematology negative hematology ROS (+)   Anesthesia Other Findings Past Medical History: No date: Anxiety No date: Depression No date: Family history of adverse reaction to anesthes* No date: Fatigue No date: Routine general medical examination at a healt* No date: Screening for lipoid disorders  History reviewed. No pertinent surgical history.  BMI    Body Mass Index:  25.89 kg/m      Reproductive/Obstetrics negative OB ROS                             Anesthesia Physical Anesthesia Plan  ASA: III  Anesthesia Plan: General LMA   Post-op Pain Management:    Induction:   Airway Management Planned:   Additional Equipment:   Intra-op Plan:   Post-operative Plan:   Informed Consent: I have reviewed the patients History and Physical, chart, labs and discussed the procedure including the risks, benefits and alternatives for the proposed anesthesia with the patient or authorized  representative who has indicated his/her understanding and acceptance.     Plan Discussed with: Anesthesiologist, CRNA and Surgeon  Anesthesia Plan Comments:         Anesthesia Quick Evaluation

## 2016-05-17 NOTE — Anesthesia Postprocedure Evaluation (Signed)
Anesthesia Post Note  Patient: Courtney Ruiz  Procedure(s) Performed: Procedure(s) (LRB): DILATATION AND EVACUATION (D&E) 2ND TRIMESTER (N/A)  Patient location during evaluation: PACU Anesthesia Type: General Level of consciousness: awake and alert Pain management: pain level controlled Vital Signs Assessment: post-procedure vital signs reviewed and stable Respiratory status: spontaneous breathing, nonlabored ventilation, respiratory function stable and patient connected to nasal cannula oxygen Cardiovascular status: blood pressure returned to baseline and stable Postop Assessment: no signs of nausea or vomiting Anesthetic complications: no    Last Vitals:  Vitals:   05/17/16 1210 05/17/16 1225  BP: (!) 93/53 (!) 93/56  Pulse: 70 63  Resp: (!) 9 11  Temp:  36.7 C    Last Pain:  Vitals:   05/17/16 1210  TempSrc:   PainSc: Asleep                 Cleda MccreedyJoseph K Piscitello

## 2016-05-17 NOTE — Brief Op Note (Signed)
05/17/2016  11:00 AM  PATIENT:  Courtney Ruiz  30 y.o. female  PRE-OPERATIVE DIAGNOSIS:  Missed Ab  POST-OPERATIVE DIAGNOSIS:  Missed abortion  PROCEDURE:  Procedure(s): DILATATION AND EVACUATION (D&E) 2ND TRIMESTER (N/A)  SURGEON:  Surgeon(s) and Role:    Suzy Bouchard* Thomas J Schermerhorn, MD - Primary  PHYSICIAN ASSISTANT:   ASSISTANTS: none   ANESTHESIA:   none  EBL:  Total I/O In: 800 [I.V.:800] Out: 800 [Blood:800]  BLOOD ADMINISTERED:none  DRAINS: none   LOCAL MEDICATIONS USED:  NONE  SPECIMEN:  Source of Specimen:  products of conception   DISPOSITION OF SPECIMEN:  PATHOLOGY  COUNTS:  YES  TOURNIQUET:  * No tourniquets in log *  DICTATION: .Other Dictation: Dictation Number verbal   PLAN OF CARE: Discharge to home after PACU  PATIENT DISPOSITION:  Hemodynamically stable  0.2 mg IM Methergine administered at the end of the case    Delay start of Pharmacological VTE agent (>24hrs) due to surgical blood loss or risk of bleeding: not applicable

## 2016-05-17 NOTE — Discharge Instructions (Signed)

## 2016-05-18 LAB — RHOGAM INJECTION: Unit division: 0

## 2016-05-18 LAB — SURGICAL PATHOLOGY

## 2016-06-14 ENCOUNTER — Other Ambulatory Visit: Payer: Self-pay | Admitting: Obstetrics and Gynecology

## 2016-06-14 DIAGNOSIS — N939 Abnormal uterine and vaginal bleeding, unspecified: Secondary | ICD-10-CM

## 2016-06-23 ENCOUNTER — Ambulatory Visit: Payer: BLUE CROSS/BLUE SHIELD

## 2016-07-22 ENCOUNTER — Encounter: Payer: Self-pay | Admitting: Family Medicine

## 2016-07-22 ENCOUNTER — Ambulatory Visit (INDEPENDENT_AMBULATORY_CARE_PROVIDER_SITE_OTHER): Payer: BLUE CROSS/BLUE SHIELD | Admitting: Family Medicine

## 2016-07-22 VITALS — BP 108/70 | HR 77 | Temp 98.1°F | Wt 140.5 lb

## 2016-07-22 DIAGNOSIS — F4323 Adjustment disorder with mixed anxiety and depressed mood: Secondary | ICD-10-CM | POA: Diagnosis not present

## 2016-07-22 DIAGNOSIS — Z23 Encounter for immunization: Secondary | ICD-10-CM

## 2016-07-22 NOTE — Assessment & Plan Note (Signed)
>  15 minutes spent in face to face time with patient, >50% spent in counselling or coordination of care. She would like to stop zoloft now that she feels acute stressors have resolved.  Denies any symptoms of anxiety or depression. Discussed how to wean off of zoloft- instructions as per AVS. The patient indicates understanding of these issues and agrees with the plan.

## 2016-07-22 NOTE — Progress Notes (Signed)
Subjective:   Patient ID: Courtney Ruiz, female    DOB: [birth year], 30 y.o.   MRN: [id number]  Courtney Ruiz is a pleasant 30 y.o. year old female who presents to clinic today with Follow-up and Medication Problem (wanting to discuss zoloft)  on 07/22/2016  HPI:  Was placed on zoloft 50 mg by OBGYN this summer while she was having a lot of nausea during pregnancy. Ended up losing the baby and has stayed on it.    She is here today to talk about zoloft.  Feels she doesn't need to take it.  Does not feel depressed or anxious.  Sleeping well.    Current Outpatient Prescriptions on File Prior to Visit  Medication Sig Dispense Refill  . cyanocobalamin 1000 MCG tablet Take 100 mcg by mouth daily.    . Prenatal Vit-Fe Fumarate-FA (PRENATAL MULTIVITAMIN) TABS tablet Take 1 tablet by mouth daily at 12 noon.    . promethazine (PHENERGAN) 25 MG tablet Take 25 mg by mouth every 6 (six) hours as needed for nausea or vomiting.    Marland Kitchen. amphetamine-dextroamphetamine (ADDERALL) 10 MG tablet Take 1 tablet (10 mg total) by mouth 2 (two) times daily. (Patient not taking: Reported on 07/22/2016) 60 tablet 0   No current facility-administered medications on file prior to visit.     Allergies  Allergen Reactions  . Tape Other (See Comments)    sensitivity    Past Medical History:  Diagnosis Date  . Anxiety   . Depression   . Family history of adverse reaction to anesthesia   . Fatigue   . PONV (postoperative nausea and vomiting)   . Routine general medical examination at a health care facility   . Screening for lipoid disorders     Past Surgical History:  Procedure Laterality Date  . DILATION AND EVACUATION N/A 05/17/2016   Procedure: DILATATION AND EVACUATION (D&E) 2ND TRIMESTER;  Surgeon: Suzy Bouchardhomas J Schermerhorn, MD;  Location: [facility] ORS;  Service: Gynecology;  Laterality: N/A;    Family History  Problem Relation Age of Onset  . Coronary artery disease Neg Hx     Social History    Social History  . Marital status: Married    Spouse name: N/A  . Number of children: 2  . Years of education: N/A   Occupational History  . dental assistant    Social History Main Topics  . Smoking status: Former Smoker    Packs/day: 0.50    Types: Cigarettes    Quit date: 01/16/2016  . Smokeless tobacco: Never Used  . Alcohol use Yes     Comment: occ  . Drug use: No  . Sexual activity: Not on file   Other Topics Concern  . Not on file   Social History Narrative   [place] kids [place].  Husband is a Engineer, maintenancedairy farmer- Dr. Cyndie Chimeopland's patient   No regular exercise   The PMH, PSH, Social History, Family History, Medications, and allergies [facility], and have been updated if relevant.   Review of Systems  Constitutional: Negative.   Psychiatric/Behavioral: Negative for agitation, behavioral problems, confusion, decreased concentration, dysphoric mood, hallucinations, self-injury, sleep disturbance and suicidal ideas. The patient is not nervous/anxious and is not hyperactive.   All other systems reviewed and are negative.      Objective:    BP 108/70   Pulse 77   Temp 98.1 F (36.7 C) (Oral)   Wt 140 lb 8 oz (63.7  kg)   LMP 07/13/2016   SpO2 98%   BMI 26.55 kg/m    Physical Exam  Constitutional: She is oriented to person, place, and time. She appears well-developed and well-nourished. No distress.  HENT:  Head: Normocephalic.  Eyes: Conjunctivae are normal.  Cardiovascular: Normal rate.   Pulmonary/Chest: Effort normal.  Musculoskeletal: Normal range of motion.  Neurological: She is alert and oriented to person, place, and time. No cranial nerve deficit.  Skin: Skin is warm and dry. She is not diaphoretic.  Psychiatric: She has a normal mood and affect. Her behavior is normal. Judgment and thought content normal.  Nursing note and vitals reviewed.         Assessment & Plan:   Need for influenza vaccination - Plan:  Flu Vaccine QUAD 36+ mos PF IM (Fluarix & Fluzone Quad PF)  Adjustment disorder with mixed anxiety and depressed mood No Follow-up on file.

## 2016-07-22 NOTE — Progress Notes (Signed)
Pre visit review using our clinic review tool, if applicable. No additional management support is needed unless otherwise documented below in the visit note. 

## 2016-07-22 NOTE — Patient Instructions (Signed)
Great to see you.  In order to wean off zoloft- take 1 tablet every other day for 2 weeks, then take 1/2 tablet every other day for 2 weeks.

## 2018-07-04 LAB — OB RESULTS CONSOLE GC/CHLAMYDIA
Chlamydia: NEGATIVE
Gonorrhea: NEGATIVE

## 2018-07-04 LAB — OB RESULTS CONSOLE HIV ANTIBODY (ROUTINE TESTING): HIV: NONREACTIVE

## 2018-07-04 LAB — OB RESULTS CONSOLE VARICELLA ZOSTER ANTIBODY, IGG: Varicella: IMMUNE

## 2018-07-04 LAB — OB RESULTS CONSOLE HEPATITIS B SURFACE ANTIGEN: Hepatitis B Surface Ag: NEGATIVE

## 2018-07-04 LAB — OB RESULTS CONSOLE RUBELLA ANTIBODY, IGM: Rubella: IMMUNE

## 2019-01-02 LAB — OB RESULTS CONSOLE GBS: GBS: NEGATIVE

## 2019-01-02 LAB — OB RESULTS CONSOLE RPR: RPR: NONREACTIVE

## 2019-01-09 ENCOUNTER — Encounter
Admission: RE | Admit: 2019-01-09 | Discharge: 2019-01-09 | Disposition: A | Discharge status: Home / Self Care | Payer: PRIVATE HEALTH INSURANCE | Source: Ambulatory Visit | Attending: Obstetrics and Gynecology | Admitting: Obstetrics and Gynecology

## 2019-01-09 ENCOUNTER — Other Ambulatory Visit: Payer: Self-pay

## 2019-01-09 DIAGNOSIS — Z01812 Encounter for preprocedural laboratory examination: Secondary | ICD-10-CM | POA: Insufficient documentation

## 2019-01-09 HISTORY — DX: Anemia, unspecified: D64.9

## 2019-01-09 HISTORY — DX: Gastro-esophageal reflux disease without esophagitis: K21.9

## 2019-01-09 LAB — CBC
HCT: 35.9 % — ABNORMAL LOW (ref 36.0–46.0)
Hemoglobin: 12 g/dL (ref 12.0–15.0)
MCH: 29.4 pg (ref 26.0–34.0)
MCHC: 33.4 g/dL (ref 30.0–36.0)
MCV: 88 fL (ref 80.0–100.0)
Platelets: 266 10*3/uL (ref 150–400)
RBC: 4.08 MIL/uL (ref 3.87–5.11)
RDW: 13.4 % (ref 11.5–15.5)
WBC: 10.3 10*3/uL (ref 4.0–10.5)
nRBC: 0 % (ref 0.0–0.2)

## 2019-01-09 NOTE — Patient Instructions (Signed)
[facility]: [facility]-THEN PROCEED TO LABOR AND DELIVERY ON 3RD FLOOR @ 6:30  Remember: Instructions that are not followed completely may result in serious medical risk, up to and including death, or upon the discretion of your surgeon and anesthesiologist your surgery may need to be rescheduled.    _x___ 1. Do not eat food after midnight the night before your procedure. NO GUM OR CANDY AFTER MIDNIGHT. You may drink clear liquids up to 2 hours before you are scheduled to arrive at the hospital for your procedure.  Do not drink clear liquids within 2 hours of your scheduled arrival to the hospital.  Clear liquids include  --Water or Apple juice without pulp  --Clear carbohydrate beverage such as ClearFast or Gatorade  --Black Coffee or Clear Tea (No milk, no creamers, do not add anything to  the coffee or Tea   ____Ensure clear carbohydrate drink on the way to the hospital for bariatric patients  ____Ensure clear carbohydrate drink 3 hours before surgery for Dr Rutherford NailByrnett's patients if physician instructed.    __x__ 2. No Alcohol for 24 hours before or after surgery.   __x__3. No Smoking or e-cigarettes for 24 prior to surgery.  Do not use any chewable tobacco products for at least 6 hour prior to surgery   ____  4. Bring all medications with you on the day of surgery if instructed.    __x__ 5. Notify your doctor if there is any change in your medical condition     (cold, fever, infections).    x___6. On the morning of surgery brush your teeth with toothpaste and water.  You may rinse your mouth with mouth wash if you wish.  Do not swallow any toothpaste or mouthwash.   Do not wear jewelry, make-up, hairpins, clips or nail polish.  Do not wear lotions, powders, or perfumes. You may wear deodorant.  Do not shave 48 hours prior to surgery. Men may shave face and neck.  Do not bring valuables to the hospital.    [facility]  is not responsible for any belongings or valuables.               Contacts, dentures or bridgework may not be worn into surgery.  Leave your suitcase in the car. After surgery it may be brought to your room.  For patients admitted to the hospital, discharge time is determined by your treatment team.  _  Patients discharged the day of surgery will not be allowed to drive home.  You will need someone to drive you home and stay with you the night of your procedure.    Please read over the following fact sheets that you were given:   [facility] Pre[facility]   ____ Take anti-hypertensive listed below, cardiac, seizure, asthma, anti-reflux and psychiatric medicines. These include:  1. NONE  2.  3.  4.  5.  6.  ____Fleets enema or Magnesium Citrate as directed.   _x___ Use CHG Soap or sage wipes as directed on instruction sheet-DO NOT WASH NIPPLES  ____ Use inhalers on the day of surgery and bring to hospital day of surgery  ____ Stop Metformin and Janumet 2 days prior to surgery.    ____ Take 1/2 of usual insulin dose the night before surgery and none on the morning     surgery.   ____ Follow recommendations from Cardiologist, Pulmonologist or PCP regarding  stopping Aspirin, Coumadin, Plavix ,Eliquis, Effient, or Pradaxa, and Pletal.  X____Stop Anti-inflammatories such as Advil, Aleve, Ibuprofen, Motrin, Naproxen, Naprosyn, Goodies powders or aspirin products. OK to take Tylenol.   ____ Stop supplements until after surgery.     ____ Bring C-Pap to the hospital.

## 2019-01-10 ENCOUNTER — Encounter: Payer: Self-pay | Admitting: *Deleted

## 2019-01-10 ENCOUNTER — Inpatient Hospital Stay: Payer: PRIVATE HEALTH INSURANCE | Admitting: Anesthesiology

## 2019-01-10 ENCOUNTER — Encounter
Admission: RE | Disposition: A | Discharge status: Home / Self Care | Payer: Self-pay | Source: Home / Self Care | Attending: Obstetrics and Gynecology

## 2019-01-10 ENCOUNTER — Inpatient Hospital Stay
Admission: RE | Admit: 2019-01-10 | Discharge: 2019-01-11 | DRG: 784 | Disposition: A | Discharge status: Home / Self Care | Payer: PRIVATE HEALTH INSURANCE | Attending: Obstetrics and Gynecology | Admitting: Obstetrics and Gynecology

## 2019-01-10 DIAGNOSIS — Z87891 Personal history of nicotine dependence: Secondary | ICD-10-CM | POA: Diagnosis not present

## 2019-01-10 DIAGNOSIS — Z3A37 37 weeks gestation of pregnancy: Secondary | ICD-10-CM

## 2019-01-10 DIAGNOSIS — O4403 Placenta previa specified as without hemorrhage, third trimester: Principal | ICD-10-CM | POA: Diagnosis present

## 2019-01-10 DIAGNOSIS — O9081 Anemia of the puerperium: Secondary | ICD-10-CM | POA: Diagnosis not present

## 2019-01-10 DIAGNOSIS — O4400 Placenta previa specified as without hemorrhage, unspecified trimester: Secondary | ICD-10-CM | POA: Diagnosis present

## 2019-01-10 DIAGNOSIS — D62 Acute posthemorrhagic anemia: Secondary | ICD-10-CM | POA: Diagnosis not present

## 2019-01-10 DIAGNOSIS — Z6791 Unspecified blood type, Rh negative: Secondary | ICD-10-CM | POA: Diagnosis not present

## 2019-01-10 DIAGNOSIS — Z302 Encounter for sterilization: Secondary | ICD-10-CM

## 2019-01-10 DIAGNOSIS — O26893 Other specified pregnancy related conditions, third trimester: Secondary | ICD-10-CM | POA: Diagnosis present

## 2019-01-10 LAB — BASIC METABOLIC PANEL WITH GFR
Anion gap: 9 (ref 5–15)
BUN: 10 mg/dL (ref 6–20)
CO2: 21 mmol/L — ABNORMAL LOW (ref 22–32)
Calcium: 8.8 mg/dL — ABNORMAL LOW (ref 8.9–10.3)
Chloride: 104 mmol/L (ref 98–111)
Creatinine, Ser: 0.6 mg/dL (ref 0.44–1.00)
GFR calc Af Amer: >60 mL/min
GFR calc non Af Amer: >60 mL/min
Glucose, Bld: 88 mg/dL (ref 70–99)
Potassium: 3.5 mmol/L (ref 3.5–5.1)
Sodium: 134 mmol/L — ABNORMAL LOW (ref 135–145)

## 2019-01-10 LAB — CBC
HCT: 34.9 % — ABNORMAL LOW (ref 36.0–46.0)
Hemoglobin: 11.8 g/dL — ABNORMAL LOW (ref 12.0–15.0)
MCH: 29.5 pg (ref 26.0–34.0)
MCHC: 33.8 g/dL (ref 30.0–36.0)
MCV: 87.3 fL (ref 80.0–100.0)
Platelets: 279 10*3/uL (ref 150–400)
RBC: 4 MIL/uL (ref 3.87–5.11)
RDW: 13.4 % (ref 11.5–15.5)
WBC: 10.4 10*3/uL (ref 4.0–10.5)
nRBC: 0 % (ref 0.0–0.2)

## 2019-01-10 SURGERY — Surgical Case
Anesthesia: Spinal | Laterality: Bilateral

## 2019-01-10 MED ORDER — BUPIVACAINE HCL (PF) 0.5 % IJ SOLN
INTRAMUSCULAR | Status: AC
  Filled 2019-01-10: qty 30

## 2019-01-10 MED ORDER — ONDANSETRON HCL 4 MG/2ML IJ SOLN
INTRAMUSCULAR | Status: DC | PRN
  Administered 2019-01-10: 4 mg via INTRAVENOUS

## 2019-01-10 MED ORDER — ACETAMINOPHEN 500 MG PO TABS
1000.0000 mg | ORAL_TABLET | Freq: Four times a day (QID) | ORAL | Status: DC
  Administered 2019-01-11 (×2): 1000 mg via ORAL
  Filled 2019-01-10 (×2): qty 2

## 2019-01-10 MED ORDER — SIMETHICONE 80 MG PO CHEW
80.0000 mg | CHEWABLE_TABLET | Freq: Three times a day (TID) | ORAL | Status: DC
  Administered 2019-01-10 – 2019-01-11 (×3): 80 mg via ORAL
  Filled 2019-01-10 (×3): qty 1

## 2019-01-10 MED ORDER — WITCH HAZEL-GLYCERIN EX PADS: 1.0000 "application " | MEDICATED_PAD | CUTANEOUS | Status: DC | PRN

## 2019-01-10 MED ORDER — SODIUM CHLORIDE (PF) 0.9 % IJ SOLN
INTRAMUSCULAR | Status: AC
  Filled 2019-01-10: qty 50

## 2019-01-10 MED ORDER — OXYCODONE HCL 5 MG PO TABS: 10.0000 mg | ORAL_TABLET | ORAL | Status: DC | PRN

## 2019-01-10 MED ORDER — MICROFIBRILLAR COLL HEMOSTAT EX PADS
MEDICATED_PAD | CUTANEOUS | Status: DC | PRN
  Administered 2019-01-10: 1 via TOPICAL

## 2019-01-10 MED ORDER — NALBUPHINE HCL 10 MG/ML IJ SOLN: 5.0000 mg | Freq: Once | INTRAMUSCULAR | Status: DC | PRN

## 2019-01-10 MED ORDER — CEFAZOLIN SODIUM-DEXTROSE 2-4 GM/100ML-% IV SOLN
2.0000 g | INTRAVENOUS | Status: AC
  Administered 2019-01-10: 2 g via INTRAVENOUS
  Filled 2019-01-10: qty 100

## 2019-01-10 MED ORDER — SOD CITRATE-CITRIC ACID 500-334 MG/5ML PO SOLN
30.0000 mL | ORAL | Status: AC
  Administered 2019-01-10: 30 mL via ORAL
  Filled 2019-01-10: qty 15

## 2019-01-10 MED ORDER — NALBUPHINE HCL 10 MG/ML IJ SOLN: 5.0000 mg | INTRAMUSCULAR | Status: DC | PRN

## 2019-01-10 MED ORDER — DIBUCAINE (PERIANAL) 1 % EX OINT: 1.0000 "application " | TOPICAL_OINTMENT | CUTANEOUS | Status: DC | PRN

## 2019-01-10 MED ORDER — OXYTOCIN 40 UNITS IN NORMAL SALINE INFUSION - SIMPLE MED
INTRAVENOUS | Status: DC | PRN
  Administered 2019-01-10: 1000 mL via INTRAVENOUS

## 2019-01-10 MED ORDER — DIPHENHYDRAMINE HCL 25 MG PO CAPS: 25.0000 mg | ORAL_CAPSULE | ORAL | Status: DC | PRN

## 2019-01-10 MED ORDER — BUPIVACAINE IN DEXTROSE 0.75-8.25 % IT SOLN
INTRATHECAL | Status: DC | PRN
  Administered 2019-01-10: 1.6 mL via INTRATHECAL

## 2019-01-10 MED ORDER — LACTATED RINGERS IV SOLN: INTRAVENOUS | Status: DC

## 2019-01-10 MED ORDER — DIPHENHYDRAMINE HCL 50 MG/ML IJ SOLN: 12.5000 mg | INTRAMUSCULAR | Status: DC | PRN

## 2019-01-10 MED ORDER — DIPHENHYDRAMINE HCL 25 MG PO CAPS: 25.0000 mg | ORAL_CAPSULE | Freq: Four times a day (QID) | ORAL | Status: DC | PRN

## 2019-01-10 MED ORDER — OXYCODONE HCL 5 MG PO TABS: 5.0000 mg | ORAL_TABLET | ORAL | Status: DC | PRN

## 2019-01-10 MED ORDER — FLEET ENEMA 7-19 GM/118ML RE ENEM: 1.0000 | ENEMA | Freq: Every day | RECTAL | Status: DC | PRN

## 2019-01-10 MED ORDER — ACETAMINOPHEN 325 MG PO TABS
650.0000 mg | ORAL_TABLET | Freq: Four times a day (QID) | ORAL | Status: AC
  Administered 2019-01-10 – 2019-01-11 (×4): 650 mg via ORAL
  Filled 2019-01-10 (×4): qty 2

## 2019-01-10 MED ORDER — GABAPENTIN 300 MG PO CAPS
300.0000 mg | ORAL_CAPSULE | Freq: Every day | ORAL | Status: DC
  Administered 2019-01-10: 23:00:00 300 mg via ORAL
  Filled 2019-01-10: qty 1

## 2019-01-10 MED ORDER — SIMETHICONE 80 MG PO CHEW
80.0000 mg | CHEWABLE_TABLET | ORAL | Status: DC | PRN
  Administered 2019-01-10: 80 mg via ORAL
  Filled 2019-01-10: qty 1

## 2019-01-10 MED ORDER — SENNOSIDES-DOCUSATE SODIUM 8.6-50 MG PO TABS
2.0000 | ORAL_TABLET | ORAL | Status: DC
  Administered 2019-01-11: 09:00:00 2 via ORAL
  Filled 2019-01-10: qty 2

## 2019-01-10 MED ORDER — HEMOSTATIC AGENTS (NO CHARGE) OPTIME: TOPICAL | Status: DC | PRN

## 2019-01-10 MED ORDER — ONDANSETRON HCL 4 MG/2ML IJ SOLN: 4.0000 mg | Freq: Three times a day (TID) | INTRAMUSCULAR | Status: DC | PRN

## 2019-01-10 MED ORDER — MORPHINE SULFATE (PF) 0.5 MG/ML IJ SOLN
INTRAMUSCULAR | Status: DC | PRN
  Administered 2019-01-10: .1 mg via INTRATHECAL

## 2019-01-10 MED ORDER — OXYTOCIN 40 UNITS IN NORMAL SALINE INFUSION - SIMPLE MED
2.5000 [IU]/h | INTRAVENOUS | Status: AC
  Administered 2019-01-10 (×2): 2.5 [IU]/h via INTRAVENOUS
  Filled 2019-01-10: qty 1000

## 2019-01-10 MED ORDER — MORPHINE SULFATE (PF) 0.5 MG/ML IJ SOLN
INTRAMUSCULAR | Status: AC
  Filled 2019-01-10: qty 10

## 2019-01-10 MED ORDER — MENTHOL 3 MG MT LOZG
1.0000 | LOZENGE | OROMUCOSAL | Status: DC | PRN
  Filled 2019-01-10: qty 9

## 2019-01-10 MED ORDER — BISACODYL 10 MG RE SUPP: 10.0000 mg | Freq: Every day | RECTAL | Status: DC | PRN

## 2019-01-10 MED ORDER — LACTATED RINGERS IV SOLN
INTRAVENOUS | Status: DC
  Administered 2019-01-10: 07:00:00 via INTRAVENOUS

## 2019-01-10 MED ORDER — FERROUS SULFATE 325 (65 FE) MG PO TABS
325.0000 mg | ORAL_TABLET | Freq: Two times a day (BID) | ORAL | Status: DC
  Administered 2019-01-10 – 2019-01-11 (×2): 325 mg via ORAL
  Filled 2019-01-10 (×2): qty 1

## 2019-01-10 MED ORDER — SODIUM CHLORIDE 0.9 % IV SOLN
INTRAVENOUS | Status: DC | PRN
  Administered 2019-01-10: 3 [IU] via INTRAVENOUS

## 2019-01-10 MED ORDER — PANTOPRAZOLE SODIUM 40 MG PO TBEC: 40.0000 mg | DELAYED_RELEASE_TABLET | Freq: Every day | ORAL | Status: DC | PRN

## 2019-01-10 MED ORDER — BUPIVACAINE LIPOSOME 1.3 % IJ SUSP
INTRAMUSCULAR | Status: AC
  Filled 2019-01-10: qty 20

## 2019-01-10 MED ORDER — COCONUT OIL OIL
1.0000 "application " | TOPICAL_OIL | Status: DC | PRN
  Administered 2019-01-11: 1 via TOPICAL
  Filled 2019-01-10: qty 120

## 2019-01-10 MED ORDER — SODIUM CHLORIDE 0.9% FLUSH: 3.0000 mL | INTRAVENOUS | Status: DC | PRN

## 2019-01-10 MED ORDER — IBUPROFEN 800 MG PO TABS
800.0000 mg | ORAL_TABLET | Freq: Four times a day (QID) | ORAL | Status: DC
  Administered 2019-01-11 (×2): 800 mg via ORAL
  Filled 2019-01-10 (×2): qty 1

## 2019-01-10 MED ORDER — NALOXONE HCL 0.4 MG/ML IJ SOLN: 0.4000 mg | INTRAMUSCULAR | Status: DC | PRN

## 2019-01-10 MED ORDER — KETOROLAC TROMETHAMINE 30 MG/ML IJ SOLN
30.0000 mg | Freq: Four times a day (QID) | INTRAMUSCULAR | Status: AC
  Administered 2019-01-10 (×2): 30 mg via INTRAVENOUS

## 2019-01-10 MED ORDER — KETOROLAC TROMETHAMINE 30 MG/ML IJ SOLN: 30.0000 mg | Freq: Four times a day (QID) | INTRAMUSCULAR | Status: AC

## 2019-01-10 MED ORDER — FENTANYL CITRATE (PF) 100 MCG/2ML IJ SOLN
INTRAMUSCULAR | Status: AC
  Filled 2019-01-10: qty 2

## 2019-01-10 MED ORDER — MEPERIDINE HCL 50 MG/ML IJ SOLN: 6.2500 mg | INTRAMUSCULAR | Status: DC | PRN

## 2019-01-10 MED ORDER — OXYTOCIN 40 UNITS IN NORMAL SALINE INFUSION - SIMPLE MED
INTRAVENOUS | Status: AC
  Filled 2019-01-10: qty 1000

## 2019-01-10 MED ORDER — PRENATAL MULTIVITAMIN CH
1.0000 | ORAL_TABLET | Freq: Every day | ORAL | Status: DC
  Administered 2019-01-11: 12:00:00 1 via ORAL
  Filled 2019-01-10: qty 1

## 2019-01-10 MED ORDER — KETOROLAC TROMETHAMINE 30 MG/ML IJ SOLN
30.0000 mg | Freq: Four times a day (QID) | INTRAMUSCULAR | Status: AC
  Administered 2019-01-10: 30 mg via INTRAVENOUS
  Filled 2019-01-10 (×3): qty 1

## 2019-01-10 MED ORDER — SODIUM CHLORIDE 0.9 % IV SOLN
INTRAVENOUS | Status: DC | PRN
  Administered 2019-01-10: 50 ug/min via INTRAVENOUS

## 2019-01-10 MED ORDER — MEASLES, MUMPS & RUBELLA VAC IJ SOLR
0.5000 mL | Freq: Once | INTRAMUSCULAR | Status: DC
  Filled 2019-01-10: qty 0.5

## 2019-01-10 MED ORDER — FENTANYL CITRATE (PF) 100 MCG/2ML IJ SOLN
INTRAMUSCULAR | Status: DC | PRN
  Administered 2019-01-10: 15 ug via INTRATHECAL

## 2019-01-10 MED ORDER — TETANUS-DIPHTH-ACELL PERTUSSIS 5-2.5-18.5 LF-MCG/0.5 IM SUSP: 0.5000 mL | Freq: Once | INTRAMUSCULAR | Status: DC

## 2019-01-10 MED ORDER — LACTATED RINGERS IV SOLN
INTRAVENOUS | Status: DC | PRN
  Administered 2019-01-10 (×2): via INTRAVENOUS

## 2019-01-10 MED ORDER — SIMETHICONE 80 MG PO CHEW: 80.0000 mg | CHEWABLE_TABLET | ORAL | Status: DC

## 2019-01-10 SURGICAL SUPPLY — 35 items
BARRIER ADHS 3X4 INTERCEED (GAUZE/BANDAGES/DRESSINGS) ×6 IMPLANT
CANISTER SUCT 3000ML PPV (MISCELLANEOUS) ×3 IMPLANT
CHLORAPREP W/TINT 26 (MISCELLANEOUS) ×3 IMPLANT
CLOSURE WOUND 1/2 X4 (GAUZE/BANDAGES/DRESSINGS) ×1
COVER WAND RF STERILE (DRAPES) IMPLANT
DRAPE SHEET LG 3/4 BI-LAMINATE (DRAPES) ×3 IMPLANT
DRESSING SURGICEL FIBRLLR 1X2 (HEMOSTASIS) ×1 IMPLANT
DRSG SURGICEL FIBRILLAR 1X2 (HEMOSTASIS) ×3
DRSG TELFA 3X8 NADH (GAUZE/BANDAGES/DRESSINGS) ×3 IMPLANT
ELECT REM PT RETURN 9FT ADLT (ELECTROSURGICAL) ×3
ELECTRODE REM PT RTRN 9FT ADLT (ELECTROSURGICAL) ×1 IMPLANT
GAUZE SPONGE 4X4 12PLY STRL (GAUZE/BANDAGES/DRESSINGS) ×3 IMPLANT
GLOVE SURG SYN 7.0 (GLOVE) ×18 IMPLANT
GOWN STRL REUS W/ TWL LRG LVL3 (GOWN DISPOSABLE) ×3 IMPLANT
GOWN STRL REUS W/TWL LRG LVL3 (GOWN DISPOSABLE) ×6
MARKER SKIN DUAL TIP RULER LAB (MISCELLANEOUS) ×3 IMPLANT
NEEDLE HYPO 25GX1X1/2 BEV (NEEDLE) IMPLANT
NS IRRIG 1000ML POUR BTL (IV SOLUTION) ×3 IMPLANT
PACK C SECTION AR (MISCELLANEOUS) ×3 IMPLANT
PAD OB MATERNITY 4.3X12.25 (PERSONAL CARE ITEMS) ×3 IMPLANT
PAD PREP 24X41 OB/GYN DISP (PERSONAL CARE ITEMS) ×3 IMPLANT
PENCIL SMOKE ULTRAEVAC 22 CON (MISCELLANEOUS) IMPLANT
SPONGE LAP 18X18 RF (DISPOSABLE) ×6 IMPLANT
STRIP CLOSURE SKIN 1/2X4 (GAUZE/BANDAGES/DRESSINGS) ×2 IMPLANT
SUT MNCRL 4-0 (SUTURE) ×2
SUT MNCRL 4-0 27XMFL (SUTURE) ×1
SUT MNCRL AB 4-0 PS2 18 (SUTURE) ×3 IMPLANT
SUT VIC AB 0 CT1 36 (SUTURE) ×6 IMPLANT
SUT VIC AB 0 CTX 36 (SUTURE) ×6
SUT VIC AB 0 CTX36XBRD ANBCTRL (SUTURE) ×3 IMPLANT
SUT VIC AB 2-0 SH 27 (SUTURE) ×4
SUT VIC AB 2-0 SH 27XBRD (SUTURE) ×2 IMPLANT
SUTURE MNCRL 4-0 27XMF (SUTURE) ×1 IMPLANT
SWABSTK COMLB BENZOIN TINCTURE (MISCELLANEOUS) ×3 IMPLANT
SYR 30ML LL (SYRINGE) ×6 IMPLANT

## 2019-01-10 NOTE — Lactation Note (Addendum)
This note was copied from a baby's chart. Lactation Consultation Note  Patient Name: Courtney Ruiz [id number] Date: 01/10/2019 Reason for consult: Initial assessment   Maternal Data Formula Feeding for Exclusion: No Does the patient have breastfeeding experience prior to this delivery?: Yes  Feeding Feeding Type: Breast Milk  LATCH Score Latch: Too sleepy or reluctant, no latch achieved, no sucking elicited.  Audible Swallowing: None  Type of Nipple: Everted at rest and after stimulation  Comfort (Breast/Nipple): Soft / non-tender  Hold (Positioning): Full assist, staff holds infant at breast  LATCH Score: 4  Interventions Interventions: Assisted with latch;Skin to skin;Breast feeding basics reviewed;Hand express;Adjust position;Support pillows;Expressed milk  Lactation Tools Discussed/Used     Consult Status  Infant was starting to root and suck on fingers in crib so LC attempted to assist with breastfeeding. Infant sucked a few times but could not maintain latch. Nipple shield was used but infant lost interest in nursing and would not open mouth or suck. Mother was taught hand expression and was able to express a teaspoon of milk which was syringe fed to infant. Infant was placed skin to skin with mother and LC will return to assist when infant showing cues to breastfeed.  LC returned to check progress of breastfeeding. Mother is experienced breastfeeder and states that baby is latching on well with no pain or discomfort.   Arlyss Gandy 01/10/2019, 11:57 AM

## 2019-01-10 NOTE — Anesthesia Preprocedure Evaluation (Signed)
Anesthesia Evaluation  Patient identified by MRN, date of birth, ID band Patient awake    Reviewed: Allergy & Precautions, NPO status , Patient's Chart, lab work & pertinent test results  History of Anesthesia Complications (+) PONV and history of anesthetic complications  Airway Mallampati: II  TM Distance: >3 FB Neck ROM: Full    Dental no notable dental hx.    Pulmonary neg sleep apnea, neg COPD, former smoker,    breath sounds clear to auscultation- rhonchi (-) wheezing      Cardiovascular Exercise Tolerance: Good (-) hypertension(-) CAD, (-) Past MI, (-) Cardiac Stents and (-) CABG  Rhythm:Regular Rate:Normal - Systolic murmurs and - Diastolic murmurs    Neuro/Psych neg Seizures PSYCHIATRIC DISORDERS Anxiety Depression negative neurological ROS     GI/Hepatic Neg liver ROS, GERD  ,  Endo/Other  negative endocrine ROSneg diabetes  Renal/GU negative Renal ROS     Musculoskeletal negative musculoskeletal ROS (+)   Abdominal Gravid abdomen  Peds  Hematology  (+) anemia ,   Anesthesia Other Findings Past Medical History: No date: Anemia     Comment:  after D&C -no problems since No date: Anxiety No date: Depression No date: Fatigue No date: GERD (gastroesophageal reflux disease) No date: PONV (postoperative nausea and vomiting)     Comment:  due to drinking alot of applejuice after procedure No date: Routine general medical examination at a health care facility No date: Screening for lipoid disorders   Reproductive/Obstetrics (+) Pregnancy                             Lab Results  Component Value Date   WBC 10.4 01/10/2019   HGB 11.8 (L) 01/10/2019   HCT 34.9 (L) 01/10/2019   MCV 87.3 01/10/2019   PLT 279 01/10/2019    Anesthesia Physical Anesthesia Plan  ASA: II  Anesthesia Plan: Spinal   Post-op Pain Management:    Induction:   PONV Risk Score and Plan: 3 and  Ondansetron  Airway Management Planned: Natural Airway  Additional Equipment:   Intra-op Plan:   Post-operative Plan:   Informed Consent: I have reviewed the patients History and Physical, chart, labs and discussed the procedure including the risks, benefits and alternatives for the proposed anesthesia with the patient or authorized representative who has indicated his/her understanding and acceptance.     Dental advisory given  Plan Discussed with: CRNA and Anesthesiologist  Anesthesia Plan Comments:         Anesthesia Quick Evaluation

## 2019-01-10 NOTE — Op Note (Signed)
Cesarean Section Procedure Note  Indications: placenta previa  Pre-operative Diagnosis:  1. Intrauterine pregnancy at [redacted]w[redacted]d;  2. Desires permanent sterilization;   Post-operative Diagnosis: same, delivered.  Procedure: 1. Primary Low Transverse Cesarean Section through Pfannenstiel incision  2. Bilateral tubal sterilization using modified Parkland method  Surgeon: Christeen Douglas, MD  Assistant(s):  Heloise Ochoa, CNM  Anesthesia: Spinal anesthesia  Estimated Blood Loss:          Drains: none         Total IV Fluids:  Urine Output:         Specimens: Portion of left and right Fallopian tubes; cord blood         Complications:  None; patient tolerated the procedure well.         Disposition: PACU - hemodynamically stable.         Condition: stable  Findings:  A female infant "Gatlyn" in cephalic presentation. Amniotic fluid - Clear  Birth weight 3010 g.  Apgars of 8 and 9.   Intact placenta with a three-vessel cord.  Grossly normal uterus, tubes and ovaries bilaterally. No intraabdominal adhesions were noted. Bleeding at hysterotomy more than usual, requiring imbrication and multiple figure of eights. Fibrillar was placed under interceed.  Procedure Details  The patient was taken to Operating Room, identified as the correct patient and the procedure verified as C-Section Delivery. A Time Out was held and the above information confirmed.  After induction of anesthesia, the patient was draped and prepped in the usual sterile manner. A Pfannenstiel incision was made and carried down through the subcutaneous tissue to the fascia. Fascial incision was made and extended transversely with the Mayo scissors. The fascia was separated from the underlying rectus tissue superiorly and inferiorly. The peritoneum was identified and entered bluntly. Peritoneal incision was extended longitudinally. The utero-vesical peritoneal reflection was incised transversely and a  bladder flap was created digitally.   A low transverse hysterotomy was made. The fetus was delivered atraumatically. The umbilical cord was clamped x2 and cut and the infant was handed to the awaiting pediatricians. The placenta was removed intact and appeared normal with a 3-vessel cord.   The uterus was exteriorized and cleared of all clot and debris. The hysterotomy was closed with running sutures of  0-Vicryl. A second imbricating layer was placed with the same suture. Excellent hemostasis was observed after the imbricating layer and multiple figure of eight sutures, and placement of Fibrillar.  Attention was then turned to the tubal ligation. The left fallopian tube distinguished from the round ligament by identifying the fimbria and was grasped with a Babcock clamp in the midisthmic portion approximately 3 cm from the cornual region. It was then doubly ligated with 0-plain gut suture in a Parkland fashion. The tubal segment was excised with Metzenbaum scissors. Fimbriated ends were included.Tubal ostea noted. The procedure was repeated on the right side. *Care was noted to examine both tubal sites in situ to ensure the sutures were intact and no bleeding was noted. The uterus was returned to the abdomen.  The pelvis was irrigated and again, excellent hemostasis was noted. The fascia was then reapproximated with running sutures of 0 Vicryl.  The subcutaneous tissue was reapproximated with interrupted sutures. The skin was reapproximated with a 4-0 Monocryl subcuticular stitch.  Exparel placed along fascial and skin lines in standard dilute fashion.  Instrument, sponge, and needle counts were correct prior to the abdominal closure and at the conclusion of the case.  The patient tolerated the procedure well and was transferred to the recovery room in stable condition.   Christeen DouglasBethany Ladye Macnaughton, MD4/15/2020

## 2019-01-10 NOTE — Transfer of Care (Signed)
Immediate Anesthesia Transfer of Care Note  Patient: Courtney Ruiz  Procedure(s) Performed: CESAREAN SECTION WITH BILATERAL TUBAL LIGATION (Bilateral )  Patient Location: L&D  Anesthesia Type:Spinal  Level of Consciousness: awake, alert  and oriented  Airway & Oxygen Therapy: Patient Spontanous Breathing  Post-op Assessment: Report given to RN and Post -op Vital signs reviewed and stable  Post vital signs: Reviewed and stable  Last Vitals:  Vitals Value Taken Time  BP    Temp    Pulse    Resp    SpO2      Last Pain:  Vitals:   01/10/19 0822  TempSrc: Oral  PainSc:          Complications: No apparent anesthesia complications

## 2019-01-10 NOTE — Anesthesia Post-op Follow-up Note (Signed)
Anesthesia QCDR form completed.        

## 2019-01-10 NOTE — Discharge Summary (Signed)
Obstetrical Discharge Summary  Patient Name: Courtney Ruiz DOB: [birth year] MRN: [id number]  Date of Admission: 01/10/2019 Date of Discharge: 01/11/2019  Primary OB: [facility]   Gestational Age at Delivery: [redacted]w[redacted]d   Antepartum complications: Placenta previa Rh neg Anxiety d/o  Admitting Diagnosis: scheduled c/s with BTL Secondary Diagnosis: Patient Active Problem List   Diagnosis Date Noted  . Placenta previa before labor and cesarean delivery without hemorrhage 01/10/2019  . Adjustment disorder with mixed anxiety and depressed mood 07/22/2016  . Vitamin D deficiency 03/24/2015  . Vitamin B12 deficiency 03/24/2015  . ADD (attention deficit disorder) 01/17/2014    Intrapartum complications/course:  Uncomplicated pLTCS and BTL  Date of Delivery: 01/10/19 Delivered By: Christeen Douglas Delivery Type: primary cesarean section, low transverse incision Anesthesia: spinal Newborn Data: Live born female "Gatlyn" Birth Weight: 6 lb 10.2 oz (3010 g) APGAR: 8, 9  Newborn Delivery   Birth date/time:  01/10/2019 09:48:00 Delivery type:  C-Section, Low Transverse Trial of labor:  No C-section categorization:  Primary     Discharge Physical Exam:  BP 117/63 (BP Location: Right Arm)   Pulse 82   Temp 98.3 F (36.8 C) (Oral)   Resp 18   Ht 5\' 1"  (1.549 m)   Wt 76.2 kg   LMP 04/22/2018   SpO2 96%   Breastfeeding Unknown   BMI 31.74 kg/m   General: NAD CV: RRR Pulm: CTABL, nl effort ABD: s/nd/nt, fundus firm and below the umbilicus Lochia: moderate Incision: c/d/i  DVT Evaluation: LE non-ttp, no evidence of DVT on exam.  Hemoglobin  Date Value Ref Range Status  01/11/2019 9.7 (L) 12.0 - 15.0 g/dL Final   HCT  Date Value Ref Range Status  01/11/2019 29.3 (L) 36.0 - 46.0 % Final    Post partum course:  Patient had an uncomplicated postpartum course.  By time of discharge on POD#2, her pain was controlled on oral pain medications; she had appropriate  lochia and was ambulating, voiding without difficulty and tolerating regular diet.  She was deemed stable for discharge to home.    Postpartum Procedures: none Disposition: stable, discharge to home. Baby Feeding: breastmilk Baby Disposition: home with mom  Rh Immune globulin given: 01/11/2019 Rubella vaccine given: n/a Tdap vaccine given in AP or PP setting: 11/07/2018 Flu vaccine given in AP or PP setting: 07/04/2018  Contraception: BTL  Prenatal Labs: Blood type/Rh --/--/O NEG (04/14 0900)  Antibody screen neg  Rubella Immune  Varicella Immune  RPR NR  HBsAg Neg  HIV NR  GC neg  Chlamydia neg  Genetic screening negative  1 hour GTT 130  3 hour GTT n/a  GBS neg    Plan:  Courtney Ruiz was discharged to home in good condition. Follow-up appointment at [facility] with delivering provider in 2 weeks by video visit   Discharge Medications: Allergies as of 01/11/2019      Reactions   Tape Other (See Comments)   Redness of skin      Medication List    STOP taking these medications   pantoprazole 40 MG tablet Commonly known as:  PROTONIX     TAKE these medications   acetaminophen 500 MG tablet Commonly known as:  TYLENOL Take 2 tablets (1,000 mg total) by mouth every 6 (six) hours as needed for mild pain or moderate pain.   amphetamine-dextroamphetamine 10 MG tablet Commonly known as:  Adderall Take 1 tablet (10 mg total) by mouth 2 (two) times daily.   ferrous  sulfate 325 (65 FE) MG tablet Take 1 tablet (325 mg total) by mouth 2 (two) times daily with a meal.   ibuprofen 800 MG tablet Commonly known as:  ADVIL Take 1 tablet (800 mg total) by mouth every 6 (six) hours as needed for mild pain, moderate pain or cramping.   prenatal multivitamin Tabs tablet Take 1 tablet by mouth daily at 12 noon.       Follow-up Information    Christeen DouglasBeasley, Bethany, MD. Go on 01/23/2019.   Specialty:  Obstetrics and Gynecology Why:  incision check at  2pm Contact information: [address] [place] [id number] [phone]           Signed: Genia DelMargaret Selassie Spatafore, CNM 01/11/2019 2:44 PM

## 2019-01-10 NOTE — Progress Notes (Signed)
QBL in OR: 1045

## 2019-01-10 NOTE — H&P (Signed)
Courtney Ruiz is a 33 y.o. G1P0 with IUP at 4529w4d presenting for presenting for scheduled cesarean section for placenta previa with BTL.  No acute concerns.   [facility] Pregnancy complications or risks: Patient Active Problem List   Diagnosis Date Noted  . Adjustment disorder with mixed anxiety and depressed mood 07/22/2016  . Vitamin D deficiency 03/24/2015  . Vitamin B12 deficiency 03/24/2015  . ADD (attention deficit disorder) 01/17/2014  Placenta previa, <1cm from internal os  She plans to breastfeed She desires bilateral tubal ligation for postpartum contraception.   Prenatal labs and studies: ABO, Rh: --/--/O NEG (04/14 0900) Antibody: POS (04/14 0900) Rubella: Immune (10/08 0000) RPR: Nonreactive (04/07 0000)  HBsAg: Negative (10/08 0000)  HIV: Non-reactive (10/08 0000)  ZOX:[id number]:Negative (04/07 0000) 1 hr Glucola  130 Genetic screening declines Anatomy US normal   Past Medical History:  Diagnosis Date  . Anemia    after D&C -no problems since  . Anxiety   . Depression   . Fatigue   . GERD (gastroesophageal reflux disease)   . PONV (postoperative nausea and vomiting)    due to drinking alot of applejuice after procedure  . Routine general medical examination at a health care facility   . Screening for lipoid disorders     Past Surgical History:  Procedure Laterality Date  . DILATION AND EVACUATION N/A 05/17/2016   Procedure: DILATATION AND EVACUATION (D&E) 2ND TRIMESTER;  Surgeon: Suzy Bouchardhomas J Schermerhorn, MD;  Location: [facility] ORS;  Service: Gynecology;  Laterality: N/A;    OB History  Gravida Para Term Preterm AB Living  7 4 4   2 4   SAB TAB Ectopic Multiple Live Births               # Outcome Date GA Lbr Len/2nd Weight Sex Delivery Anes PTL Lv  7 Current           6 AB           5 Term           4 Term           3 AB           2 Term           1 Term             Social History   Socioeconomic History  . Marital status:  Married    Spouse name: Not on file  . Number of children: 2  . Years of education: Not on file  . Highest education level: Not on file  Occupational History  . Occupation: Sales executivedental assistant  Social Needs  . Financial resource strain: Not on file  . Food insecurity:    Worry: Not on file    Inability: Not on file  . Transportation needs:    Medical: Not on file    Non-medical: Not on file  Tobacco Use  . Smoking status: Former Smoker    Packs/day: 0.50    Types: Cigarettes    Last attempt to quit: 01/16/2016    Years since quitting: 2.9  . Smokeless tobacco: Never Used  Substance and Sexual Activity  . Alcohol use: Not Currently  . Drug use: No  . Sexual activity: Not on file  Lifestyle  . Physical activity:    Days per week: Not on file    Minutes per session: Not on file  . Stress: Not on file  Relationships  . Social  connections:    Talks on phone: Not on file    Gets together: Not on file    Attends religious service: Not on file    Active member of club or organization: Not on file    Attends meetings of clubs or organizations: Not on file    Relationship status: Not on file  Other Topics Concern  . Not on file  Social History Narrative   Lives with husband and 2 kids in [place].  Husband is a Engineer, maintenance- Dr. Cyndie Chime patient   No regular exercise    Family History  Problem Relation Age of Onset  . Coronary artery disease Neg Hx     Medications Prior to Admission  Medication Sig Dispense Refill Last Dose  . pantoprazole (PROTONIX) 40 MG tablet Take 40 mg by mouth daily as needed (heartburn).   Past Week at Unknown time  . Prenatal Vit-Fe Fumarate-FA (PRENATAL MULTIVITAMIN) TABS tablet Take 1 tablet by mouth daily at 12 noon.   01/09/2019 at Unknown time  . amphetamine-dextroamphetamine (ADDERALL) 10 MG tablet Take 1 tablet (10 mg total) by mouth 2 (two) times daily. (Patient not taking: Reported on 07/22/2016) 60 tablet 0 Not Taking    Allergies   Allergen Reactions  . Tape Other (See Comments)    Redness of skin    Review of Systems: Negative except for what is mentioned in HPI.  Physical Exam: BP 116/74   Pulse 68   Temp 97.8 F (36.6 C) (Oral)   Resp 16   Ht  (1.549 m)   Wt 76.2 kg   LMP 04/22/2018   BMI 31.74 kg/m  FHR by Doppler: 145 bpm CONSTITUTIONAL: Well-developed, well-nourished female in no acute distress.  HENT:  Normocephalic, atraumatic, External right and left ear normal. Oropharynx is clear and moist EYES: Conjunctivae and EOM are normal. Pupils are equal, round, and reactive to light. No scleral icterus.  NECK: Normal range of motion, supple, no masses SKIN: Skin is warm and dry. No rash noted. Not diaphoretic. No erythema. No pallor. NEUROLGIC: Alert and oriented to person, place, and time. Normal reflexes, muscle tone coordination. No cranial nerve deficit noted. PSYCHIATRIC: Normal mood and affect. Normal behavior. Normal judgment and thought content. CARDIOVASCULAR: Normal heart rate noted, regular rhythm RESPIRATORY: Effort and breath sounds normal, no problems with respiration noted ABDOMEN: Soft, nontender, nondistended, gravid. PELVIC: Deferred MUSCULOSKELETAL: Normal range of motion. No edema and no tenderness. 2+ distal pulses.   Pertinent Labs/Studies:   Results for orders placed or performed during the hospital encounter of 01/10/19 (from the past 72 hour(s))  CBC     Status: Abnormal   Collection Time: 01/10/19  6:58 AM  Result Value Ref Range   WBC 10.4 4.0 - 10.5 K/uL   RBC 4.00 3.87 - 5.11 MIL/uL   Hemoglobin 11.8 (L) 12.0 - 15.0 g/dL   HCT 16.1 (L) 09.6 - 04.5 %   MCV 87.3 80.0 - 100.0 fL   MCH 29.5 26.0 - 34.0 pg   MCHC 33.8 30.0 - 36.0 g/dL   RDW 40.9 81.1 - 91.4 %   Platelets 279 150 - 400 K/uL   nRBC 0.0 0.0 - 0.2 %    Comment: Performed at [facility], [address] [place], [place] [address]  Basic metabolic panel     Status: Abnormal   Collection  Time: 01/10/19  6:58 AM  Result Value Ref Range   Sodium 134 (L) 135 - 145 mmol/L   Potassium  3.5 3.5 - 5.1 mmol/L   Chloride 104 98 - 111 mmol/L   CO2 21 (L) 22 - 32 mmol/L   Glucose, Bld 88 70 - 99 mg/dL   BUN 10 6 - 20 mg/dL   Creatinine, Ser 3.36 0.44 - 1.00 mg/dL   Calcium 8.8 (L) 8.9 - 10.3 mg/dL   GFR calc non Af Amer >60 >60 mL/min   GFR calc Af Amer >60 >60 mL/min   Anion gap 9 5 - 15    Comment: Performed at [facility], [address], [place], [place] [address]    Assessment and Plan :Courtney Ruiz is a 33 y.o. [id number] at 103w4d being admitted being admitted for scheduled primary cesarean section for placenta previa. The risks of cesarean section discussed with the patient included but were not limited to: bleeding which may require transfusion or reoperation; infection which may require antibiotics; injury to bowel, bladder, ureters or other surrounding organs; injury to the fetus; need for additional procedures including hysterectomy in the event of a life-threatening hemorrhage; placental abnormalities wth subsequent pregnancies, incisional problems, thromboembolic phenomenon and other postoperative/anesthesia complications. The patient concurred with the proposed plan, giving informed written consent for the procedure. Patient has been NPO since last night she will remain NPO for procedure. Anesthesia and OR aware. Preoperative prophylactic antibiotics and SCDs ordered on call to the OR. To OR when ready.

## 2019-01-11 LAB — BPAM RBC
Blood Product Expiration Date: 202005032359
Blood Product Expiration Date: 202005042359
Unit Type and Rh: 9500
Unit Type and Rh: 9500

## 2019-01-11 LAB — TYPE AND SCREEN
ABO/RH(D): O NEG
Antibody Screen: POSITIVE
Extend sample reason: UNDETERMINED
Unit division: 0
Unit division: 0

## 2019-01-11 LAB — CBC
HCT: 29.3 % — ABNORMAL LOW (ref 36.0–46.0)
Hemoglobin: 9.7 g/dL — ABNORMAL LOW (ref 12.0–15.0)
MCH: 29.5 pg (ref 26.0–34.0)
MCHC: 33.1 g/dL (ref 30.0–36.0)
MCV: 89.1 fL (ref 80.0–100.0)
Platelets: 211 10*3/uL (ref 150–400)
RBC: 3.29 MIL/uL — ABNORMAL LOW (ref 3.87–5.11)
RDW: 13.6 % (ref 11.5–15.5)
WBC: 13.2 10*3/uL — ABNORMAL HIGH (ref 4.0–10.5)
nRBC: 0 % (ref 0.0–0.2)

## 2019-01-11 LAB — SURGICAL PATHOLOGY

## 2019-01-11 LAB — RPR: RPR Ser Ql: NONREACTIVE

## 2019-01-11 LAB — FETAL SCREEN: Fetal Screen: NEGATIVE

## 2019-01-11 MED ORDER — FERROUS SULFATE 325 (65 FE) MG PO TABS: 325.0000 mg | ORAL_TABLET | Freq: Two times a day (BID) | ORAL | 1 refills | Status: AC

## 2019-01-11 MED ORDER — RHO D IMMUNE GLOBULIN 1500 UNIT/2ML IJ SOSY
300.0000 ug | PREFILLED_SYRINGE | Freq: Once | INTRAMUSCULAR | Status: AC
  Administered 2019-01-11: 300 ug via INTRAVENOUS
  Filled 2019-01-11: qty 2

## 2019-01-11 MED ORDER — IBUPROFEN 800 MG PO TABS: 800.0000 mg | ORAL_TABLET | Freq: Four times a day (QID) | ORAL | 0 refills | Status: AC | PRN

## 2019-01-11 MED ORDER — ACETAMINOPHEN 500 MG PO TABS: 1000.0000 mg | ORAL_TABLET | Freq: Four times a day (QID) | ORAL | 0 refills | Status: AC | PRN

## 2019-01-11 NOTE — Progress Notes (Signed)
Post Op Day 1  Subjective: Doing well, no concerns. Ambulating without difficulty, pain managed with PO meds, tolerating regular diet, and voiding without difficulty.   No fever/chills, chest pain, shortness of breath, nausea/vomiting, or leg pain. No nipple or breast pain.   Objective: BP 117/63 (BP Location: Right Arm)   Pulse 82   Temp 98.3 F (36.8 C) (Oral)   Resp 18   Ht 5\' 1"  (1.549 m)   Wt 76.2 kg   LMP 04/22/2018   SpO2 96%   Breastfeeding Unknown   BMI 31.74 kg/m    Physical Exam:  General: alert, cooperative, appears stated age and no distress Breasts: soft/nontender CV: RRR Pulm: nl effort, CTABL Abdomen: soft, non-tender, active bowel sounds Uterine Fundus: firm Incision: healing well, no significant drainage, no dehiscence, no significant erythema Lochia: appropriate DVT Evaluation: No evidence of DVT seen on physical exam. No cords or calf tenderness. No significant calf/ankle edema.  Recent Labs    01/10/19 0658 01/11/19 0402  HGB 11.8* 9.7*  HCT 34.9* 29.3*  WBC 10.4 13.2*  PLT 279 211    Assessment/Plan: 32 y.o. [id number] postop day # 1  -Continue routine postpartum care -Lactation consult PRN for breastfeeding -BTL completed with delivery for contraception.  -Acute blood loss anemia - hemodynamically stable and asymptomatic; continue PO ferrous sulfate BID with stool softeners  -Immunization status: all immunizations up to date  Disposition: Desires discharge home today if baby cleared for discharge   LOS: 1 day   Genia Del, CNM 01/11/2019, 9:27 AM   ----- Genia Del Certified Nurse Midwife [facility]

## 2019-01-11 NOTE — Plan of Care (Signed)
Vs stable; iv is SL'd; taking tylenol and motrin now for pain control; now up ad lib; tolerating regular diet; breastfeeding and needs some assistance now due to using SNS at breast

## 2019-01-11 NOTE — Progress Notes (Signed)
Patient discharged home with infant. Discharge instructions and prescriptions given and reviewed with patient. Patient verbalized understanding. Will be Escorted out by staff.

## 2019-01-11 NOTE — Anesthesia Postprocedure Evaluation (Signed)
Anesthesia Post Note  Patient: Courtney Ruiz  Procedure(s) Performed: CESAREAN SECTION WITH BILATERAL TUBAL LIGATION (Bilateral )  Patient location during evaluation: Mother Baby Anesthesia Type: Spinal Level of consciousness: oriented and awake and alert Pain management: pain level controlled Vital Signs Assessment: post-procedure vital signs reviewed and stable Respiratory status: spontaneous breathing, respiratory function stable and patient connected to nasal cannula oxygen Cardiovascular status: blood pressure returned to baseline and stable Postop Assessment: no headache, no backache and no apparent nausea or vomiting Anesthetic complications: no     Last Vitals:  Vitals:   01/11/19 0838 01/11/19 0900  BP: 117/63   Pulse: 71 82  Resp: 18   Temp: 36.8 C   SpO2: 98% 96%    Last Pain:  Vitals:   01/11/19 0838  TempSrc: Oral  PainSc:                  Rica Mast

## 2019-01-11 NOTE — Anesthesia Post-op Follow-up Note (Signed)
  Anesthesia Pain Follow-up Note  Patient: SHAKOYA FILTER  Day #: 1  Date of Follow-up: 01/11/2019 Time: 10:28 AM  Last Vitals:  Vitals:   01/11/19 0838 01/11/19 0900  BP: 117/63   Pulse: 71 82  Resp: 18   Temp: 36.8 C   SpO2: 98% 96%    Level of Consciousness: alert  Pain: mild   Side Effects:None  Catheter Site Exam:clean     Plan: D/C from anesthesia care at surgeon's request  Rica Mast

## 2019-01-11 NOTE — Discharge Instructions (Signed)
After Your Delivery Discharge Instructions °  °Postpartum: Care Instructions ° °After childbirth (postpartum period), your body goes through many changes. Some of these changes happen over several weeks. In the hours after delivery, your body will begin to recover from childbirth while it prepares to breastfeed your newborn. You may feel emotional during this time. Your hormones can shift your mood without warning for no clear reason. ° °In the first couple of weeks after childbirth, many women have emotions that change from happy to sad. You may find it hard to sleep. You may cry a lot. This is called the "baby blues." These overwhelming emotions often go away within a couple of days or weeks. But it's important to discuss your feelings with your doctor. ° °You should call your care provider if you have unrelieved feelings of: °· Inability to cope °· Sadness °· Anxiety °· Lack of interest in baby °· Insomnia °· Crying ° °It is easy to get too tired and overwhelmed during the first weeks after childbirth. Don't try to do too much. Get rest whenever you can, accept help from others, and eat well and drink plenty of fluids. ° °About 4 to 6 weeks after your baby's birth, you will have a follow-up visit with your care provider. This visit is your time to talk to your provider about anything you are concerned or curious about. ° °Follow-up care is a key part of your treatment and safety. Be sure to make and go to all appointments, and call your doctor if you are having problems. It's also a good idea to know your test results and keep a list of the medicines you take. ° °How can you care for yourself at home? °· Sleep or rest when your baby sleeps. °· Get help with household chores from family or friends, if you can. Do not try to do it all yourself. °· If you have hemorrhoids or swelling or pain around the opening of your vagina, try using cold and heat. You can put ice or a cold pack on the area for 10 to 20 minutes at  a time. Put a thin cloth between the ice and your skin. Also try sitting in a few inches of warm water (sitz bath) 3 times a day and after bowel movements. °· Take pain medicines exactly as directed. °· If the provider gave you a prescription medicine for pain, take it as prescribed. °· If you do not have a prescription and need something over the counter, you can take: °· Ibuprofen (Motrin, Advil) up to 600mg every 6 hours as needed for pain °· Acetaminophen (Tylenol) up to 650mg every 4 hours as needed for pain °· Some people find it helpful to alternate between these two medications.  °· No driving for 1-2 weeks or while taking pain medications.  °· Eat more fiber to avoid constipation. Include foods such as whole-grain breads and cereals, raw vegetables, raw and dried fruits, and beans. °· Drink plenty of fluids, enough so that your urine is light yellow or clear like water. If you have kidney, heart, or liver disease and have to limit fluids, talk with your doctor before you increase the amount of fluids you drink. °· Do not put anything in the vagina for 6 weeks. This means no sex, no tampons, no douching, and no enemas. °· If you have stitches, keep the area clean by pouring or spraying warm water over the area outside your vagina and anus after you use the toilet. °·   No strenuous activity or heavy lifting for 6 weeks   No tub baths; showers only  Continue prenatal vitamin and iron.  If breastfeeding:  Increase calories and fluids while breastfeeding.  You may have a slight fever when your milk comes in, but it should go away on its own. If it does not, and rises above 101.0 please call the doctor.  For breastfeeding concerns, the lactation consultant can be reached at [phone].  For concerns about your baby, please call your pediatrician.   Keep a list of questions to bring to your postpartum visit. Your questions might be about:  Changes in your breasts, such as lumps or  soreness.  When to expect your menstrual period to start again.  What form of birth control is best for you.  Weight you have put on during the pregnancy.  Exercise options.  What foods and drinks are best for you, especially if you are breastfeeding.  Problems you might be having with breastfeeding.  When you can have sex. Some women may want to talk about lubricants for the vagina.  Any feelings of sadness or restlessness that you are having.   When should you call for help?  Call 911 anytime you think you may need emergency care. For example, call if:  You have thoughts of harming yourself, your baby, or another person.  You passed out (lost consciousness).  Call the office at [phone] or seek immediate medical care if:  If you have heavy bleeding such that you are soaking 1 pad in an hour for 2 hours  You are dizzy or lightheaded, or you feel like you may faint.  You have a fever; a temperature of 101.0 F or greater  Chills  Difficulty urinating  Headache unrelieved by "pain meds"   Visual changes  Pain in the right side of your belly near your ribs  Breasts reddened, hard, hot to the touch or any other breast concerns  Nipple discharge which is foul-smelling or contains pus   Increased pain at the site of the surgical incision   Incision drainage or problems  New pain unrelieved with recommended over-the-counter dosages  Difficulty breathing with or without chest pain   New leg pain, swelling, or redness, especially if it is only on one leg  Any other concerns  Watch closely for changes in your health, and be sure to contact your provider if:  You have new or worse vaginal discharge.  You feel sad or depressed.  You are having problems with your breasts or breastfeeding.     Cesarean Delivery, Care After This sheet gives you information about how to care for yourself after your procedure. Your health care provider may also give you  more specific instructions. If you have problems or questions, contact your health care provider. What can I expect after the procedure? After the procedure, it is common to have:  A small amount of blood or clear fluid coming from the incision.  Some redness, swelling, and pain in your incision area.  Some abdominal pain and soreness.  Vaginal bleeding (lochia). Even though you did not have a vaginal delivery, you will still have vaginal bleeding and discharge.  Pelvic cramps.  Fatigue. You may have pain, swelling, and discomfort in the tissue between your vagina and your anus (perineum) if:  Your C-section was unplanned, and you were allowed to labor and push.  An incision was made in the area (episiotomy) or the tissue tore during attempted vaginal delivery. Follow  these instructions at home: Incision care   Follow instructions from your health care provider about how to take care of your incision. Make sure you: ? Wash your hands with soap and water before you change your bandage (dressing). If soap and water are not available, use hand sanitizer. ? If you have a dressing, change it or remove it as told by your health care provider. ? Leave stitches (sutures), skin staples, skin glue, or adhesive strips in place. These skin closures may need to stay in place for 2 weeks or longer. If adhesive strip edges start to loosen and curl up, you may trim the loose edges. Do not remove adhesive strips completely unless your health care provider tells you to do that.  Check your incision area every day for signs of infection. Check for: ? More redness, swelling, or pain. ? More fluid or blood. ? Warmth. ? Pus or a bad smell.  Do not take baths, swim, or use a hot tub until your health care provider says it's okay. Ask your health care provider if you can take showers.  When you cough or sneeze, hug a pillow. This helps with pain and decreases the chance of your incision opening up  (dehiscing). Do this until your incision heals. Medicines  Take over-the-counter and prescription medicines only as told by your health care provider.  If you were prescribed an antibiotic medicine, take it as told by your health care provider. Do not stop taking the antibiotic even if you start to feel better.  Do not drive or use heavy machinery while taking prescription pain medicine. Lifestyle  Do not drink alcohol. This is especially important if you are breastfeeding or taking pain medicine.  Do not use any products that contain nicotine or tobacco, such as cigarettes, e-cigarettes, and chewing tobacco. If you need help quitting, ask your health care provider. Eating and drinking  Drink at least 8 eight-ounce glasses of water every day unless told not to by your health care provider. If you breastfeed, you may need to drink even more water.  Eat high-fiber foods every day. These foods may help prevent or relieve constipation. High-fiber foods include: ? Whole grain cereals and breads. ? Brown rice. ? Beans. ? Fresh fruits and vegetables. Activity   If possible, have someone help you care for your baby and help with household activities for at least a few days after you leave the hospital.  Return to your normal activities as told by your health care provider. Ask your health care provider what activities are safe for you.  Rest as much as possible. Try to rest or take a nap while your baby is sleeping.  Do not lift anything that is heavier than 10 lbs (4.5 kg), or the limit that you were told, until your health care provider says that it is safe.  Talk with your health care provider about when you can engage in sexual activity. This may depend on your: ? Risk of infection. ? How fast you heal. ? Comfort and desire to engage in sexual activity. General instructions  Do not use tampons or douches until your health care provider approves.  Wear loose, comfortable clothing  and a supportive and well-fitting bra.  Keep your perineum clean and dry. Wipe from front to back when you use the toilet.  If you pass a blood clot, save it and call your health care provider to discuss. Do not flush blood clots down the toilet before you get  instructions from your health care provider.  Keep all follow-up visits for you and your baby as told by your health care provider. This is important. Contact a health care provider if:  You have: ? A fever. ? Bad-smelling vaginal discharge. ? Pus or a bad smell coming from your incision. ? Difficulty or pain when urinating. ? A sudden increase or decrease in the frequency of your bowel movements. ? More redness, swelling, or pain around your incision. ? More fluid or blood coming from your incision. ? A rash. ? Nausea. ? Little or no interest in activities you used to enjoy. ? Questions about caring for yourself or your baby.  Your incision feels warm to the touch.  Your breasts turn red or become painful or hard.  You feel unusually sad or worried.  You vomit.  You pass a blood clot from your vagina.  You urinate more than usual.  You are dizzy or light-headed. Get help right away if:  You have: ? Pain that does not go away or get better with medicine. ? Chest pain. ? Difficulty breathing. ? Blurred vision or spots in your vision. ? Thoughts about hurting yourself or your baby. ? New pain in your abdomen or in one of your legs. ? A severe headache.  You faint.  You bleed from your vagina so much that you fill more than one sanitary pad in one hour. Bleeding should not be heavier than your heaviest period. Summary  After the procedure, it is common to have pain at your incision site, abdominal cramping, and slight bleeding from your vagina.  Check your incision area every day for signs of infection.  Tell your health care provider about any unusual symptoms.  Keep all follow-up visits for you and your  baby as told by your health care provider. This information is not intended to replace advice given to you by your health care provider. Make sure you discuss any questions you have with your health care provider. Document Released: 06/05/2002 Document Revised: 03/22/2018 Document Reviewed: 03/22/2018 [employer] Interactive Patient Education  2019 [employer].

## 2019-01-12 LAB — RHOGAM INJECTION: Unit division: 0

## 2019-02-15 DIAGNOSIS — N640 Fissure and fistula of nipple: Secondary | ICD-10-CM | POA: Diagnosis not present

## 2019-05-07 DIAGNOSIS — H5213 Myopia, bilateral: Secondary | ICD-10-CM | POA: Diagnosis not present

## 2019-05-07 DIAGNOSIS — H52223 Regular astigmatism, bilateral: Secondary | ICD-10-CM | POA: Diagnosis not present

## 2019-05-22 DIAGNOSIS — H5213 Myopia, bilateral: Secondary | ICD-10-CM | POA: Diagnosis not present

## 2019-06-05 DIAGNOSIS — H9203 Otalgia, bilateral: Secondary | ICD-10-CM | POA: Diagnosis not present

## 2019-06-05 DIAGNOSIS — H6123 Impacted cerumen, bilateral: Secondary | ICD-10-CM | POA: Diagnosis not present

## 2019-08-04 DIAGNOSIS — Z23 Encounter for immunization: Secondary | ICD-10-CM | POA: Diagnosis not present

## 2021-08-02 DIAGNOSIS — B349 Viral infection, unspecified: Secondary | ICD-10-CM | POA: Diagnosis not present

## 2022-02-11 DIAGNOSIS — J019 Acute sinusitis, unspecified: Secondary | ICD-10-CM | POA: Diagnosis not present

## 2022-08-09 DIAGNOSIS — Z20822 Contact with and (suspected) exposure to covid-19: Secondary | ICD-10-CM | POA: Diagnosis not present

## 2022-08-09 DIAGNOSIS — J0100 Acute maxillary sinusitis, unspecified: Secondary | ICD-10-CM | POA: Diagnosis not present

## 2022-08-09 DIAGNOSIS — R059 Cough, unspecified: Secondary | ICD-10-CM | POA: Diagnosis not present

## 2022-08-23 DIAGNOSIS — Z0000 Encounter for general adult medical examination without abnormal findings: Secondary | ICD-10-CM | POA: Diagnosis not present

## 2022-08-31 DIAGNOSIS — Z0000 Encounter for general adult medical examination without abnormal findings: Secondary | ICD-10-CM | POA: Diagnosis not present

## 2022-08-31 DIAGNOSIS — Z23 Encounter for immunization: Secondary | ICD-10-CM | POA: Diagnosis not present

## 2022-08-31 DIAGNOSIS — Z131 Encounter for screening for diabetes mellitus: Secondary | ICD-10-CM | POA: Diagnosis not present

## 2022-08-31 DIAGNOSIS — Z130 Encounter for screening for diseases of the blood and blood-forming organs and certain disorders involving the immune mechanism: Secondary | ICD-10-CM | POA: Diagnosis not present

## 2022-08-31 DIAGNOSIS — Z1331 Encounter for screening for depression: Secondary | ICD-10-CM | POA: Diagnosis not present

## 2022-08-31 DIAGNOSIS — Z13220 Encounter for screening for lipoid disorders: Secondary | ICD-10-CM | POA: Diagnosis not present

## 2022-08-31 DIAGNOSIS — Z1329 Encounter for screening for other suspected endocrine disorder: Secondary | ICD-10-CM | POA: Diagnosis not present

## 2022-08-31 DIAGNOSIS — R41840 Attention and concentration deficit: Secondary | ICD-10-CM | POA: Diagnosis not present

## 2022-09-09 DIAGNOSIS — Z03818 Encounter for observation for suspected exposure to other biological agents ruled out: Secondary | ICD-10-CM | POA: Diagnosis not present

## 2022-09-09 DIAGNOSIS — J0100 Acute maxillary sinusitis, unspecified: Secondary | ICD-10-CM | POA: Diagnosis not present

## 2022-10-14 DIAGNOSIS — R41840 Attention and concentration deficit: Secondary | ICD-10-CM | POA: Diagnosis not present

## 2023-01-07 DIAGNOSIS — J0100 Acute maxillary sinusitis, unspecified: Secondary | ICD-10-CM | POA: Diagnosis not present

## 2023-01-07 DIAGNOSIS — J029 Acute pharyngitis, unspecified: Secondary | ICD-10-CM | POA: Diagnosis not present

## 2024-01-24 DIAGNOSIS — H5213 Myopia, bilateral: Secondary | ICD-10-CM | POA: Diagnosis not present
